# Patient Record
Sex: Female | Born: 1995 | Hispanic: Yes | Marital: Single | Smoking: Never smoker
Health system: Southern US, Community
[De-identification: ages and names within clinical notes are randomized; demographics above are authoritative.]

## PROBLEM LIST (undated history)

## (undated) DIAGNOSIS — F419 Anxiety disorder, unspecified: Secondary | ICD-10-CM

## (undated) DIAGNOSIS — R569 Unspecified convulsions: Secondary | ICD-10-CM

## (undated) DIAGNOSIS — F32A Depression, unspecified: Secondary | ICD-10-CM

## (undated) DIAGNOSIS — R51 Headache: Secondary | ICD-10-CM

## (undated) DIAGNOSIS — G8929 Other chronic pain: Secondary | ICD-10-CM

## (undated) DIAGNOSIS — F329 Major depressive disorder, single episode, unspecified: Secondary | ICD-10-CM

---
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
## 2014-10-24 ENCOUNTER — Encounter: Payer: Self-pay | Admitting: Diagnostic Neuroimaging

## 2014-10-24 ENCOUNTER — Ambulatory Visit (INDEPENDENT_AMBULATORY_CARE_PROVIDER_SITE_OTHER): Payer: Self-pay | Admitting: Diagnostic Neuroimaging

## 2014-10-24 VITALS — BP 102/67 | HR 78 | Ht 62.0 in | Wt 202.0 lb

## 2014-10-24 DIAGNOSIS — F411 Generalized anxiety disorder: Secondary | ICD-10-CM

## 2014-10-24 DIAGNOSIS — F32A Depression, unspecified: Secondary | ICD-10-CM

## 2014-10-24 DIAGNOSIS — F329 Major depressive disorder, single episode, unspecified: Secondary | ICD-10-CM

## 2014-10-24 DIAGNOSIS — G40209 Localization-related (focal) (partial) symptomatic epilepsy and epileptic syndromes with complex partial seizures, not intractable, without status epilepticus: Secondary | ICD-10-CM

## 2014-10-24 MED ORDER — LAMOTRIGINE 25 MG PO TABS: ORAL_TABLET | ORAL | Status: DC

## 2014-10-24 NOTE — Progress Notes (Signed)
[facility]  PATIENT: Tami Rodriguez DOB: [birth year]  REFERRING CLINICIAN: [facility] (Dr. Neita Carp) HISTORY FROM: patient and EPIC chart review REASON FOR VISIT: new consult     HISTORICAL  CHIEF COMPLAINT:  Chief Complaint  Patient presents with  . Seizures    rm 6, New Patient, second opinion    HISTORY OF PRESENT ILLNESS:   19 year old right-handed female here for evaluation of seizure disorder.  2014 patient had onset of episodes of staring spells, distraction, amnesia, confusion, with some generalized "shaking" movements. No tongue biting or incontinence. Patient went to the emergency room after these were witnessed by patient's boyfriend several times. 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 Sharene Skeans [facility]). She was started on Lamictal but could not afford due to cost. Carbamazepine was tried but this caused hives and patient went back to Lamictal. Ultimately she was able to afford medication with change in insurance. In 2015 levetiracetam was added to achieve better seizure control, but this was discontinued due to causing "anger spells". In April 2015 patient did not feel well on all these medications, and then stopped following up with neurology and stopped her antiseizure medications.  Over the past 1-1/2 years patient seizures have returned. Over the past 3-4 months she is having increasing episodes, almost on a daily basis, with staring and confusion. Since January 2016, she has developed significant depression and anxiety, including racing thoughts, auditory hallucinations, suicidal thoughts, irregular sleep patterns. She was seeing a psychologist, but now is in the process of transition to a new clinic.  Patient lives with her boyfriend currently. She is apparently estranged from her family, and lives a "lonely" life.  Patient is able to contract for  safety regarding her suicidal thoughts.   REVIEW OF SYSTEMS: Full 14 system review of systems performed and notable only for memory loss confusion headache slurred speech depression anxiety to much sleep not asleep decreased energy change in appetite disinterest in activity suicidal thoughts hallucinations racing thoughts blood in stool blurred vision weight gain.   ALLERGIES: Allergies  Allergen Reactions  . Tegretol [Carbamazepine] Rash  . Penicillins Rash    HOME MEDICATIONS: Outpatient Prescriptions Prior to Visit  Medication Sig Dispense Refill  . HYDROcodone-acetaminophen (NORCO/VICODIN) 5-325 MG per tablet Take one-two tabs po q 4-6 hrs prn pain 12 tablet 0  . predniSONE (DELTASONE) 10 MG tablet Take 6 tablets day one, 5 tablets day two, 4 tablets day three, 3 tablets day four, 2 tablets day five, then 1 tablet day six 21 tablet 0  . traMADol (ULTRAM) 50 MG tablet Take 1 tablet (50 mg total) by mouth every 6 (six) hours as needed. 15 tablet 0   No facility-administered medications prior to visit.    PAST MEDICAL HISTORY: Past Medical History  Diagnosis Date  . Chronic headaches   . Seizures     10/24/14 unsure of last sz    PAST SURGICAL HISTORY: Past Surgical History  Procedure Laterality Date  . Appendectomy  2011    FAMILY HISTORY: Family History  Problem Relation Age of Onset  . Stroke Maternal Grandmother     Died in her 60's  . Dementia Paternal Grandmother   . Seizures Cousin   . Healthy Sister   . Healthy Brother   . Healthy Sister     SOCIAL HISTORY:  Social History   Social History  . Marital Status: Single    Spouse Name:  N/A  . Number of Children: 0  . Years of Education: 12   Occupational History  .      [employer]   Social History Main Topics  . Smoking status: Never Smoker   . Smokeless tobacco: Never Used  . Alcohol Use: No  . Drug Use: No  . Sexual Activity:    Partners: Male    Birth Control/ Protection: Condom   Other Topics  Concern  . Not on file   Social History Narrative   Lives at home with sig other   Caffeine use- green tea 3 x a week, Red Bull occas     PHYSICAL EXAM  GENERAL EXAM/CONSTITUTIONAL: Vitals:  Filed Vitals:   10/24/14 0844  BP: 102/67  Pulse: 78  Height:  (1.575 m)  Weight: 202 lb (91.627 kg)     Body mass index is 36.94 kg/(m^2).  Visual Acuity Screening   Right eye Left eye Both eyes  Without correction: 20/200 20/100   With correction:     Comments: 10/24/14 glasses currently being repaired    Patient is in no distress; well developed, nourished and groomed; neck is supple  CARDIOVASCULAR:  Examination of carotid arteries is normal; no carotid bruits  Regular rate and rhythm, no murmurs  Examination of peripheral vascular system by observation and palpation is normal  EYES:  Ophthalmoscopic exam of optic discs and posterior segments is normal; no papilledema or hemorrhages  MUSCULOSKELETAL:  Gait, strength, tone, movements noted in Neurologic exam below  NEUROLOGIC: MENTAL STATUS:  No flowsheet data found.  awake, alert, oriented to person, place and time  recent and remote memory intact  normal attention and concentration  language fluent, comprehension intact, naming intact,   fund of knowledge appropriate  CRANIAL NERVE:   2nd - no papilledema on fundoscopic exam  2nd, 3rd, 4th, 6th - pupils equal and reactive to light, visual fields full to confrontation, extraocular muscles intact, no nystagmus  5th - facial sensation symmetric  7th - facial strength symmetric  8th - hearing intact  9th - palate elevates symmetrically, uvula midline  11th - shoulder shrug symmetric  12th - tongue protrusion midline  MOTOR:   normal bulk and tone, full strength in the BUE, BLE  SENSORY:   normal and symmetric to light touch, pinprick, temperature, vibration  COORDINATION:   finger-nose-finger, fine finger movements normal  REFLEXES:    deep tendon reflexes present and symmetric  GAIT/STATION:   narrow based gait; able to walk tandem; romberg is negative    DIAGNOSTIC DATA (LABS, IMAGING, TESTING) - I reviewed patient records, labs, notes, testing and imaging myself where available.  Lab Results  Component Value Date   WBC 5.3 09/17/2012   HGB 12.3 09/17/2012   HCT 35.5 09/17/2012   MCV 90 09/17/2012   PLT 171 09/17/2012      Component Value Date/Time   NA 139 06/02/2012 1501   K 4.0 06/02/2012 1501   CL 103 06/02/2012 1501   CO2 27 04/29/2012 2058   GLUCOSE 104* 06/02/2012 1501   BUN 3* 06/02/2012 1501   CREATININE 0.60 06/02/2012 1501   CALCIUM 9.3 04/29/2012 2058   PROT 7.6 04/29/2012 2058   ALBUMIN 4.0 04/29/2012 2058   AST 15 04/29/2012 2058   ALT 14 08/18/2012 0838   ALKPHOS 72 04/29/2012 2058   BILITOT 0.2* 04/29/2012 2058   GFRNONAA NOT CALCULATED 04/29/2012 2058   GFRAA NOT CALCULATED 04/29/2012 2058   No results found for: CHOL, HDL,  LDLCALC, LDLDIRECT, TRIG, CHOLHDL No results found for: ZOXW9U No results found for: VITAMINB12 No results found for: TSH   05/20/12 EEG  DESCRIPTION OF FINDINGS:  - Dominant frequency is 11 Hz, 30 microvolt alpha range activity that attenuates with eye opening. Background activity consists of low-voltage alpha and upper theta range, and frontally predominant beta range components. - During hyperventilation, the patient initially has nonepileptic behavior with nodding her head and movement of her body. This occurs at about 1 minutes and 50 seconds. The patient has sudden onset of 60 V rhythmic alpha range activity over the entire left hemisphere and coincident appearance 5 seconds later of 35-40 microvolt rhythmic theta range activity over the right hemisphere. This lasts for about 9 seconds. The technologist mentions that she has staring on her face but this could not be seen on the video. The patient then has 7 Hz generalized theta range activity,  which slows to 5 Hz predominantly over the left hemisphere and then generalized delta range activity. The entire episode lasted for 42 seconds. This involved pages 21 through 26 in the record. On page 103-107. A very similar event starts spontaneously and is associated with rhythmic alpha range activity of 10 Hz over the left hemisphere, and theta and upper delta range activity over the right hemisphere. Then, she has Generalized rhythmic theta range activity followed by rhythmic generalized delta range activity at the end. During this time, there is slight movement of the right leg. Her eyes remain closed.   IMPRESSION: The 2 episodes described above represent electrographic seizures. In the 1st, the technologist said that the patient was staring. In the 2nd, no notation was made concerning the behaviors. 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   07/17/12 MRI brain [I reviewed images myself. There may be slight decrease in size of left hippocampus compared to right. I do not appreciate significant difference in signal. -VRP]  1. Mild asymmetric increased signal of the right amygdala/hippocampal complex. Favor related to recent seizure activity. 2. Otherwise the MRI appearance of the brain is within normal limits.     ASSESSMENT AND PLAN  19 y.o. 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 Other significant factors include psychosocial stress, depression, anxiety, reduced support network, limited financial resources, lack of health insurance.   Dx:  Partial symptomatic epilepsy with complex partial seizures, not intractable, without status epilepticus  Depression  Anxiety state    PLAN: - restart lamotrigine - pregnancy  and anti-seizure medication risk/safety reviewed with patient - follow up with psychiatry/psychology ([facility]); advised her to go to ER immediately if mood or thoughts become more severe adn if she needs help for her safety or the safety of others - no driving until seizure free x 6 months  Meds ordered this encounter  Medications  . lamoTRIgine (LAMICTAL) 25 MG tablet    Sig: start LAMOTRIGINE  twice a day x 2 weeks; - then increase to  twice a day x 2 weeks; - then increase to  twice a day x 2 weeks; - then increase to  twice a day    Dispense:  120 tablet    Refill:  6   Return in about 6 weeks (around 12/05/2014).    Suanne Marker, MD 10/24/2014, 9:22 AM Certified in Neurology, Neurophysiology and Neuroimaging  [facility] [address] [place],  [address] [phone]

## 2014-10-24 NOTE — Patient Instructions (Addendum)
Thank you for coming to see [place] at [facility]. I hope we have been able to provide you high quality care today.  You may receive a patient satisfaction survey over the next few weeks. We would appreciate your feedback and comments so that we may continue to improve ourselves and the health of our patients.  - start LAMOTRIGINE 19m twice a day x 2 weeks; - then increase to 557mtwice a day x 2 weeks; - then increase to 7568mwice a day x 2 weeks; - then increase to 100m53mice a day  - According to [place] law, you can not drive unless you are seizure free for at least 6 months and under physician's care.   - Please maintain seizure precautions. Do not participate in activities where a loss of awareness could harm you or someone else. No swimming alone, no tub bathing, no hot tubs, no driving, no operating motorized vehicles (cars, ATVs, motocycles, etc), lawnmowers or power tools. No standing at heights, such as rooftops, ladders or stairs. Avoid hot objects such as stoves, heaters, open fires. Wear a helmet when riding a bicycle, scooter, skateboard, etc. and avoid areas of traffic. Set your water heater to 120 degrees or less.    ~~~~~~~~~~~~~~~~~~~~~~~~~~~~~~~~~~~~~~~~~~~~~~~~~~~~~~~~~~~~~~~~~  DR. PENUMALLI'S GUIDE TO HAPPY AND HEALTHY LIVING These are some of my general health and wellness recommendations. Some of them may apply to you better than others. Please use common sense as you try these suggestions and feel free to ask me any questions.   ACTIVITY/FITNESS Mental, social, emotional and physical stimulation are very important for brain and body health. Try learning a new activity (arts, music, language, sports, games).  Keep moving your body to the best of your abilities. You can do this at home, inside or outside, the park, community center, gym or anywhere you like. Consider a physical therapist or personal trainer to get started. Consider the app Sworkit. Fitness  trackers such as smart-watches, smart-phones or Fitbits can help as well.   NUTRITION Eat more plants: colorful vegetables, nuts, seeds and berries.  Eat less sugar, salt, preservatives and processed foods.  Avoid toxins such as cigarettes and alcohol.  Drink water when you are thirsty. Warm water with a slice of lemon is an excellent morning drink to start the day.  Consider these websites for more information The Nutrition Source (httphttps://www.henry-hernandez.biz/ecision Nutrition (www.WindowBlog.chRELAXATION Consider practicing mindfulness meditation or other relaxation techniques such as deep breathing, prayer, yoga, tai chi, massage. See website mindful.org or the apps Headspace or Calm to help get started.   SLEEP Try to get at least 7-8+ hours sleep per day. Regular exercise and reduced caffeine will help you sleep better. Practice good sleep hygeine techniques. See website sleep.org for more information.

## 2014-11-29 ENCOUNTER — Encounter (HOSPITAL_COMMUNITY): Payer: Self-pay | Admitting: Emergency Medicine

## 2014-11-29 ENCOUNTER — Emergency Department (HOSPITAL_COMMUNITY)
Admission: EM | Admit: 2014-11-29 | Discharge: 2014-11-29 | Disposition: A | Discharge status: Home / Self Care | Payer: Self-pay | Attending: Emergency Medicine | Admitting: Emergency Medicine

## 2014-11-29 ENCOUNTER — Emergency Department (HOSPITAL_COMMUNITY): Payer: Self-pay

## 2014-11-29 DIAGNOSIS — Y998 Other external cause status: Secondary | ICD-10-CM | POA: Insufficient documentation

## 2014-11-29 DIAGNOSIS — W010XXA Fall on same level from slipping, tripping and stumbling without subsequent striking against object, initial encounter: Secondary | ICD-10-CM | POA: Insufficient documentation

## 2014-11-29 DIAGNOSIS — Y9289 Other specified places as the place of occurrence of the external cause: Secondary | ICD-10-CM | POA: Insufficient documentation

## 2014-11-29 DIAGNOSIS — G8929 Other chronic pain: Secondary | ICD-10-CM | POA: Insufficient documentation

## 2014-11-29 DIAGNOSIS — Y9389 Activity, other specified: Secondary | ICD-10-CM | POA: Insufficient documentation

## 2014-11-29 DIAGNOSIS — Z880 Allergy status to penicillin: Secondary | ICD-10-CM | POA: Insufficient documentation

## 2014-11-29 DIAGNOSIS — S93401A Sprain of unspecified ligament of right ankle, initial encounter: Secondary | ICD-10-CM | POA: Insufficient documentation

## 2014-11-29 DIAGNOSIS — G40909 Epilepsy, unspecified, not intractable, without status epilepticus: Secondary | ICD-10-CM | POA: Insufficient documentation

## 2014-11-29 MED ORDER — IBUPROFEN 800 MG PO TABS
800.0000 mg | ORAL_TABLET | Freq: Three times a day (TID) | ORAL | Status: DC
Start: 2014-11-29 — End: 2015-02-20

## 2014-11-29 NOTE — ED Provider Notes (Signed)
CSN: [id number]     Arrival date & time 11/29/14  1637 History   First MD Initiated Contact with Patient 11/29/14 1647     Chief Complaint  Patient presents with  . Ankle Injury     (Consider location/radiation/quality/duration/timing/severity/associated sxs/prior Treatment) HPI  Tami BaldingMaria Rodriguez is a 19 y.o. female who presents to the Emergency Department complaining of sudden onset of right ankle pain and swelling that began after a twisting injury to the right ankle.  Injury occurred this morning and she complains of increasing pain.  Pain is worse with weight bearing and focused to the outside portion of her ankle.  She denies proximal pain, numbness or weakness, open wound, or other injuries.  She has not applied ice or taken any medications for her symptoms.     Past Medical History  Diagnosis Date  . Chronic headaches   . Seizures (HCC)     10/24/14 unsure of last sz   Past Surgical History  Procedure Laterality Date  . Appendectomy  2011   Family History  Problem Relation Age of Onset  . Stroke Maternal Grandmother     Died in her 5250's  . Dementia Paternal Grandmother   . Seizures Cousin   . Healthy Sister   . Healthy Brother   . Healthy Sister    Social History  Substance Use Topics  . Smoking status: Never Smoker   . Smokeless tobacco: Never Used  . Alcohol Use: No   OB History    No data available     Review of Systems  Constitutional: Negative for fever and chills.  Genitourinary: Negative for dysuria and difficulty urinating.  Musculoskeletal: Positive for joint swelling and arthralgias (right ankle pain and swelling).  Skin: Negative for color change and wound.  Neurological: Negative for weakness and numbness.  All other systems reviewed and are negative.     Allergies  Tegretol and Penicillins  Home Medications   Prior to Admission medications   Medication Sig Start Date End Date Taking? Authorizing Provider  lamoTRIgine (LAMICTAL) 25 MG tablet  start LAMOTRIGINE 25mg  twice a day x 2 weeks; - then increase to 50mg  twice a day x 2 weeks; - then increase to 75mg  twice a day x 2 weeks; - then increase to 100mg  twice a day 10/24/14   Suanne MarkerVikram R Penumalli, MD  naproxen (NAPROSYN) 250 MG tablet Take 250 mg by mouth 2 (two) times daily as needed.    Historical Provider, MD   BP 122/62 mmHg  Pulse 92  Temp(Src) 98.3 F (36.8 C) (Oral)  Resp 14  Ht 5\' 2"  (1.575 m)  Wt 200 lb (90.719 kg)  BMI 36.57 kg/m2  SpO2 98%   Physical Exam  Constitutional: She is oriented to person, place, and time. She appears well-developed and well-nourished. No distress.  HENT:  Head: Normocephalic and atraumatic.  Cardiovascular: Normal rate, regular rhythm, normal heart sounds and intact distal pulses.   Pulmonary/Chest: Effort normal and breath sounds normal. No respiratory distress.  Musculoskeletal: She exhibits edema and tenderness.  Tenderness and edema of the lateral right ankle.  DP pulse is brisk,distal sensation intact.  No erythema, wound, bruising or bony deformity.  No proximal tenderness.  Neurological: She is alert and oriented to person, place, and time. She exhibits normal muscle tone. Coordination normal.  Skin: Skin is warm and dry.  Nursing note and vitals reviewed.   ED Course  Procedures (including critical care time)  Imaging Review Dg Ankle Complete Right  11/29/2014  CLINICAL DATA:  Right ankle pain status post fall, moderate swelling EXAM: RIGHT ANKLE - COMPLETE 3+ VIEW COMPARISON:  None. FINDINGS: No fracture or dislocation is seen. The ankle mortise is intact. The base of the fifth metatarsal is unremarkable. Moderate swelling overlying the lateral malleolus. IMPRESSION: No fracture or dislocation is seen. Moderate lateral soft tissue swelling. Electronically Signed   By: Charline Bills M.D.   On: 11/29/2014 17:13   I have personally reviewed and evaluated these images and lab results as part of my medical  decision-making.    MDM   Final diagnoses:  Ankle sprain, right, initial encounter    ASO applied, patient has crutches at home  NV intact, pain improved.  She agrees to symptomatic tx, RICE and orthopedic f/u in one week if needed.      Rosey Bath 11/29/14 2035  Bethann Berkshire, MD 11/29/14 2159

## 2014-11-29 NOTE — ED Notes (Signed)
Pt reports "stumbled and fell" on rocks this am. Pt reports right ankle pain ever since. Moderate swelling noted to right ankle. Pt able to bear weight but with increased pain.nad noted.

## 2014-11-29 NOTE — Discharge Instructions (Signed)
Ankle Sprain °An ankle sprain is an injury to the strong, fibrous tissues (ligaments) that hold your ankle bones together.  °HOME CARE  °· Put ice on your ankle for 1-2 days or as told by your doctor. °¨ Put ice in a plastic bag. °¨ Place a towel between your skin and the bag. °¨ Leave the ice on for 15-20 minutes at a time, every 2 hours while you are awake. °· Only take medicine as told by your doctor. °· Raise (elevate) your injured ankle above the level of your heart as much as possible for 2-3 days. °· Use crutches if your doctor tells you to. Slowly put your own weight on the affected ankle. Use the crutches until you can walk without pain. °· If you have a plaster splint: °¨ Do not rest it on anything harder than a pillow for 24 hours. °¨ Do not put weight on it. °¨ Do not get it wet. °¨ Take it off to shower or bathe. °· If given, use an elastic wrap or support stocking for support. Take the wrap off if your toes lose feeling (numb), tingle, or turn cold or blue. °· If you have an air splint: °¨ Add or let out air to make it comfortable. °¨ Take it off at night and to shower and bathe. °¨ Wiggle your toes and move your ankle up and down often while you are wearing it. °GET HELP IF: °· You have rapidly increasing bruising or puffiness (swelling). °· Your toes feel very cold. °· You lose feeling in your foot. °· Your medicine does not help your pain. °GET HELP RIGHT AWAY IF:  °· Your toes lose feeling (numb) or turn blue. °· You have severe pain that is increasing. °MAKE SURE YOU:  °· Understand these instructions. °· Will watch your condition. °· Will get help right away if you are not doing well or get worse. °  °This information is not intended to replace advice given to you by your health care provider. Make sure you discuss any questions you have with your health care provider. °  °Document Released: 07/03/2007 Document Revised: 02/04/2014 Document Reviewed: 07/29/2011 °Elsevier Interactive Patient  Education ©2016 [employer]vier Inc. ° °

## 2014-12-06 ENCOUNTER — Ambulatory Visit (INDEPENDENT_AMBULATORY_CARE_PROVIDER_SITE_OTHER): Payer: Self-pay | Admitting: Diagnostic Neuroimaging

## 2014-12-06 ENCOUNTER — Encounter: Payer: Self-pay | Admitting: Diagnostic Neuroimaging

## 2014-12-06 VITALS — BP 117/75 | HR 83 | Ht 62.0 in | Wt 200.0 lb

## 2014-12-06 DIAGNOSIS — N912 Amenorrhea, unspecified: Secondary | ICD-10-CM

## 2014-12-06 DIAGNOSIS — R110 Nausea: Secondary | ICD-10-CM

## 2014-12-06 DIAGNOSIS — G40209 Localization-related (focal) (partial) symptomatic epilepsy and epileptic syndromes with complex partial seizures, not intractable, without status epilepticus: Secondary | ICD-10-CM

## 2014-12-06 MED ORDER — LAMOTRIGINE 100 MG PO TABS: 100.0000 mg | ORAL_TABLET | Freq: Two times a day (BID) | ORAL | Status: DC

## 2014-12-06 NOTE — Patient Instructions (Signed)
-   continue lamotrigine 100mg  twice a day - I will check pregnancy test today - follow up with Dr. Neita CarpSasser and psychiatry regarding nausea, fatigue, chill and possible medication side effects

## 2014-12-06 NOTE — Progress Notes (Signed)
9  [facility]  PATIENT: Tami Rodriguez DOB: [birth year]  REFERRING CLINICIAN: [facility] (Dr. Neita Carp) HISTORY FROM: patient and EPIC chart review REASON FOR VISIT: follow up     HISTORICAL  CHIEF COMPLAINT:  Chief Complaint  Patient presents with  . Epilepsy    rm 7  . Follow-up    6 weeks    HISTORY OF PRESENT ILLNESS:   UPDATE 12/06/14: Since last visit, started LTG, now doing better. Within 1-2 weeks, stopped having sz spells. Also started quetiapine since last visit and doing better. Now with intermittent nausea, early AM, and has missed her period. She states she does not think she is pregnant, but she is sexually active. She has not had period since June 2016.   PRIOR HPI (10/24/14): 19 year old right-handed female here for evaluation of seizure disorder. 2014 patient had onset of episodes of staring spells, distraction, amnesia, confusion, with some generalized "shaking" movements. No tongue biting or incontinence. Patient went to the emergency room after these were witnessed by patient's boyfriend several times. 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 Sharene Skeans [facility]). She was started on Lamictal but could not afford due to cost. Carbamazepine was tried but this caused hives and patient went back to Lamictal. Ultimately she was able to afford medication with change in insurance. In 2015 levetiracetam was added to achieve better seizure control, but this was discontinued due to causing "anger spells". In April 2015 patient did not feel well on all these medications, and then stopped following up with neurology and stopped her antiseizure medications. Over the past 1-1/2 years patient seizures have returned. Over the past 3-4 months she is having increasing episodes, almost on a daily basis, with staring and confusion. Since January 2016, she has developed significant  depression and anxiety, including racing thoughts, auditory hallucinations, suicidal thoughts, irregular sleep patterns. She was seeing a psychologist, but now is in the process of transition to a new clinic. Patient lives with her boyfriend currently. She is apparently estranged from her family, and lives a "lonely" life. Patient is able to contract for safety regarding her suicidal thoughts.   REVIEW OF SYSTEMS: Full 14 system review of systems performed and notable only for headache dizziness  depresion anxiety nausea chills fatigue  ALLERGIES: Allergies  Allergen Reactions  . Tegretol [Carbamazepine] Rash  . Penicillins Rash    HOME MEDICATIONS: Outpatient Prescriptions Prior to Visit  Medication Sig Dispense Refill  . ibuprofen (ADVIL,MOTRIN) 800 MG tablet Take 1 tablet (800 mg total) by mouth 3 (three) times daily. 21 tablet 0  . lamoTRIgine (LAMICTAL) 25 MG tablet start LAMOTRIGINE  twice a day x 2 weeks; - then increase to  twice a day x 2 weeks; - then increase to  twice a day x 2 weeks; - then increase to  twice a day 120 tablet 6  . naproxen (NAPROSYN) 250 MG tablet Take 250 mg by mouth 2 (two) times daily as needed.     No facility-administered medications prior to visit.    PAST MEDICAL HISTORY: Past Medical History  Diagnosis Date  . Chronic headaches   . Seizures (HCC)     10/24/14 unsure of last sz    PAST SURGICAL HISTORY: Past Surgical History  Procedure Laterality Date  . Appendectomy  2011    FAMILY HISTORY: Family History  Problem Relation Age of Onset  . Stroke Maternal Grandmother     Died  in her 33's  . Dementia Paternal Grandmother   . Seizures Cousin   . Healthy Sister   . Healthy Brother   . Healthy Sister     SOCIAL HISTORY:  Social History   Social History  . Marital Status: Single    Spouse Name: N/A  . Number of Children: 0  . Years of Education: 12   Occupational History  .      [employer]   Social History  Main Topics  . Smoking status: Never Smoker   . Smokeless tobacco: Never Used  . Alcohol Use: No  . Drug Use: No  . Sexual Activity:    Partners: Male    Birth Control/ Protection: Condom   Other Topics Concern  . Not on file   Social History Narrative   Lives at home with sig other   Caffeine use- green tea 3 x a week, Red Bull occas     PHYSICAL EXAM  GENERAL EXAM/CONSTITUTIONAL: Vitals:  Filed Vitals:   12/06/14 1253  BP: 117/75  Pulse: 83  Height:  (1.575 m)  Weight: 200 lb (90.719 kg)   Body mass index is 36.57 kg/(m^2). No exam data present  Patient is in no distress; well developed, nourished and groomed; neck is supple  CARDIOVASCULAR:  Examination of carotid arteries is normal; no carotid bruits  Regular rate and rhythm, no murmurs  Examination of peripheral vascular system by observation and palpation is normal   MUSCULOSKELETAL:  Gait, strength, tone, movements noted in Neurologic exam below  NEUROLOGIC: MENTAL STATUS:  No flowsheet data found.  awake, alert, oriented to person, place and time  recent and remote memory intact  normal attention and concentration  language fluent, comprehension intact, naming intact,   fund of knowledge appropriate  CRANIAL NERVE:   2nd, 3rd, 4th, 6th - pupils equal and reactive to light, visual fields full to confrontation, extraocular muscles intact, no nystagmus  5th - facial sensation symmetric  7th - facial strength symmetric  8th - hearing intact  9th - palate elevates symmetrically, uvula midline  11th - shoulder shrug symmetric  12th - tongue protrusion midline  MOTOR:   normal bulk and tone, full strength in the BUE, BLE  SENSORY:   normal and symmetric to light touch, temperature, vibration  COORDINATION:   finger-nose-finger, fine finger movements normal  REFLEXES:   deep tendon reflexes present and symmetric  GAIT/STATION:   narrow based gait; able to walk tandem;  romberg is negative    DIAGNOSTIC DATA (LABS, IMAGING, TESTING) - I reviewed patient records, labs, notes, testing and imaging myself where available.  Lab Results  Component Value Date   WBC 5.3 09/17/2012   HGB 12.3 09/17/2012   HCT 35.5 09/17/2012   MCV 90 09/17/2012   PLT 171 09/17/2012      Component Value Date/Time   NA 139 06/02/2012 1501   K 4.0 06/02/2012 1501   CL 103 06/02/2012 1501   CO2 27 04/29/2012 2058   GLUCOSE 104* 06/02/2012 1501   BUN 3* 06/02/2012 1501   CREATININE 0.60 06/02/2012 1501   CALCIUM 9.3 04/29/2012 2058   PROT 7.6 04/29/2012 2058   ALBUMIN 4.0 04/29/2012 2058   AST 15 04/29/2012 2058   ALT 14 08/18/2012 0838   ALKPHOS 72 04/29/2012 2058   BILITOT 0.2* 04/29/2012 2058   GFRNONAA NOT CALCULATED 04/29/2012 2058   GFRAA NOT CALCULATED 04/29/2012 2058   No results found for: CHOL, HDL, LDLCALC, LDLDIRECT, TRIG, CHOLHDL No  results found for: HGBA1C No results found for: VITAMINB12 No results found for: TSH   05/20/12 EEG  DESCRIPTION OF FINDINGS:  - Dominant frequency is 11 Hz, 30 microvolt alpha range activity that attenuates with eye opening. Background activity consists of low-voltage alpha and upper theta range, and frontally predominant beta range components. - During hyperventilation, the patient initially has nonepileptic behavior with nodding her head and movement of her body. This occurs at about 1 minutes and 50 seconds. The patient has sudden onset of 60 V rhythmic alpha range activity over the entire left hemisphere and coincident appearance 5 seconds later of 35-40 microvolt rhythmic theta range activity over the right hemisphere. This lasts for about 9 seconds. The technologist mentions that she has staring on her face but this could not be seen on the video. The patient then has 7 Hz generalized theta range activity, which slows to 5 Hz predominantly over the left hemisphere and then generalized delta range activity. The  entire episode lasted for 42 seconds. This involved pages 21 through 26 in the record. On page 103-107. A very similar event starts spontaneously and is associated with rhythmic alpha range activity of 10 Hz over the left hemisphere, and theta and upper delta range activity over the right hemisphere. Then, she has Generalized rhythmic theta range activity followed by rhythmic generalized delta range activity at the end. During this time, there is slight movement of the right leg. Her eyes remain closed.   IMPRESSION: The 2 episodes described above represent electrographic seizures. In the 1st, the technologist said that the patient was staring. In the 2nd, no notation was made concerning the behaviors. 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   07/17/12 MRI brain [I reviewed images myself. There may be slight decrease in size of left hippocampus compared to right. I do not appreciate significant difference in signal. -VRP]  1. Mild asymmetric increased signal of the right amygdala/hippocampal complex. Favor related to recent seizure activity. 2. Otherwise the MRI appearance of the brain is within normal limits.     ASSESSMENT AND PLAN  19 y.o. 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 Other significant factors include psychosocial stress, depression, anxiety, reduced support network, limited financial resources, lack of health insurance.  Now back on lamotrigine 100mg  twice a day. No more seizures.   Dx:  No diagnosis found.   PLAN: - continue lamotrigine 100mg  BID - pregnancy and anti-seizure medication risk/safety reviewed with patient - will check pregnancy test (missed period, early AM nausea); then follow up with PCP or  ob/gyn - follow up with psychiatry/psychology ([facility])  Orders Placed This Encounter  Procedures  . Pregnancy, urine   Meds ordered this encounter  Medications  . lamoTRIgine (LAMICTAL) 100 MG tablet    Sig: Take 1 tablet (100 mg total) by mouth 2 (two) times daily.    Dispense:  60 tablet    Refill:  12   Return in about 3 months (around 03/08/2015).    Suanne MarkerVIKRAM R. Latroya Ng, MD 12/06/2014, 1:08 PM Certified in Neurology, Neurophysiology and Neuroimaging  [facility] [address], [address] [place], [place] [address] [phone]

## 2014-12-07 ENCOUNTER — Telehealth: Payer: Self-pay | Admitting: *Deleted

## 2014-12-07 LAB — PREGNANCY, URINE: PREG TEST UR: NEGATIVE

## 2014-12-07 NOTE — Telephone Encounter (Signed)
Spoke with patient and informed her that her lab test yesterday was negative. She stated she was vomiting last night, is not at this time. Advised she call her PCP and request to be seen this week to be evaluated. She verbalized understanding, agreement.

## 2015-02-20 ENCOUNTER — Emergency Department (HOSPITAL_COMMUNITY)
Admission: EM | Admit: 2015-02-20 | Discharge: 2015-02-20 | Disposition: A | Discharge status: Home / Self Care | Payer: Self-pay | Attending: Emergency Medicine | Admitting: Emergency Medicine

## 2015-02-20 ENCOUNTER — Encounter (HOSPITAL_COMMUNITY): Payer: Self-pay | Admitting: *Deleted

## 2015-02-20 ENCOUNTER — Emergency Department (HOSPITAL_COMMUNITY): Payer: Self-pay

## 2015-02-20 DIAGNOSIS — F419 Anxiety disorder, unspecified: Secondary | ICD-10-CM | POA: Insufficient documentation

## 2015-02-20 DIAGNOSIS — R079 Chest pain, unspecified: Secondary | ICD-10-CM | POA: Insufficient documentation

## 2015-02-20 DIAGNOSIS — Z79899 Other long term (current) drug therapy: Secondary | ICD-10-CM | POA: Insufficient documentation

## 2015-02-20 DIAGNOSIS — Z880 Allergy status to penicillin: Secondary | ICD-10-CM | POA: Insufficient documentation

## 2015-02-20 DIAGNOSIS — F3130 Bipolar disorder, current episode depressed, mild or moderate severity, unspecified: Secondary | ICD-10-CM | POA: Insufficient documentation

## 2015-02-20 DIAGNOSIS — Z791 Long term (current) use of non-steroidal anti-inflammatories (NSAID): Secondary | ICD-10-CM | POA: Insufficient documentation

## 2015-02-20 DIAGNOSIS — G8929 Other chronic pain: Secondary | ICD-10-CM | POA: Insufficient documentation

## 2015-02-20 HISTORY — DX: Anxiety disorder, unspecified: F41.9

## 2015-02-20 HISTORY — DX: Depression, unspecified: F32.A

## 2015-02-20 HISTORY — DX: Major depressive disorder, single episode, unspecified: F32.9

## 2015-02-20 LAB — BASIC METABOLIC PANEL
ANION GAP: 6 (ref 5–15)
BUN: 10 mg/dL (ref 6–20)
CALCIUM: 9.2 mg/dL (ref 8.9–10.3)
CO2: 26 mmol/L (ref 22–32)
CREATININE: 0.7 mg/dL (ref 0.44–1.00)
Chloride: 108 mmol/L (ref 101–111)
Glucose, Bld: 120 mg/dL — ABNORMAL HIGH (ref 65–99)
Potassium: 4.3 mmol/L (ref 3.5–5.1)
Sodium: 140 mmol/L (ref 135–145)

## 2015-02-20 LAB — CBC
HCT: 35 % — ABNORMAL LOW (ref 36.0–46.0)
HEMOGLOBIN: 12 g/dL (ref 12.0–15.0)
MCH: 30 pg (ref 26.0–34.0)
MCHC: 34.3 g/dL (ref 30.0–36.0)
MCV: 87.5 fL (ref 78.0–100.0)
PLATELETS: 212 10*3/uL (ref 150–400)
RBC: 4 MIL/uL (ref 3.87–5.11)
RDW: 12.7 % (ref 11.5–15.5)
WBC: 9.8 10*3/uL (ref 4.0–10.5)

## 2015-02-20 LAB — I-STAT TROPONIN, ED: TROPONIN I, POC: 0 ng/mL (ref 0.00–0.08)

## 2015-02-20 MED ORDER — IBUPROFEN 800 MG PO TABS: 800.0000 mg | ORAL_TABLET | Freq: Three times a day (TID) | ORAL | Status: DC | PRN

## 2015-02-20 MED ORDER — KETOROLAC TROMETHAMINE 30 MG/ML IJ SOLN
30.0000 mg | Freq: Once | INTRAMUSCULAR | Status: AC
  Administered 2015-02-20: 30 mg via INTRAVENOUS
  Filled 2015-02-20: qty 1

## 2015-02-20 NOTE — ED Notes (Signed)
Pt reports mid chest pain that started while at work tonight. Describes pain as sharp, sticking pain. Pt states SOB w/ chest pains.

## 2015-02-20 NOTE — ED Provider Notes (Signed)
TIME SEEN: 1:30 AM  CHIEF COMPLAINT: Chest pain  HPI: Pt is a 20 y.o. female with history of seizures on Lamictal, depression who presents emergency with left-sided sharp nonradiating chest pain that started while at work this evening. Pain is worse with deep inspiration and palpation. It is not exertional. States she felt short of breath because it hurts to take a deep breath. No nausea, vomiting, dizziness or diaphoresis. No fevers or cough. No family history of premature CAD.  No history of PE, DVT, exogenous estrogen use, fracture, surgery, trauma, hospitalization, prolonged travel. No lower extremity swelling or pain. No calf tenderness.    ROS: See HPI Constitutional: no fever  Eyes: no drainage  ENT: no runny nose   Cardiovascular:   chest pain  Resp: no SOB  GI: no vomiting GU: no dysuria Integumentary: no rash  Allergy: no hives  Musculoskeletal: no leg swelling  Neurological: no slurred speech ROS otherwise negative  PAST MEDICAL HISTORY/PAST SURGICAL HISTORY:  Past Medical History  Diagnosis Date  . Chronic headaches   . Seizures (HCC)     10/24/14 unsure of last sz  . Depression   . Bipolar affect, depressed (HCC)   . Anxiety     MEDICATIONS:  Prior to Admission medications   Medication Sig Start Date End Date Taking? Authorizing Provider  ARIPiprazole (ABILIFY) 20 MG tablet Take 20 mg by mouth daily.   Yes Historical Provider, MD  lamoTRIgine (LAMICTAL) 100 MG tablet Take 1 tablet (100 mg total) by mouth 2 (two) times daily. 12/06/14  Yes Suanne Marker, MD  ibuprofen (ADVIL,MOTRIN) 800 MG tablet Take 1 tablet (800 mg total) by mouth 3 (three) times daily. 11/29/14   Tammy Triplett, PA-C  naproxen (NAPROSYN) 250 MG tablet Take 250 mg by mouth 2 (two) times daily as needed.    Historical Provider, MD  QUEtiapine (SEROQUEL) 100 MG tablet Take 100 mg by mouth at bedtime.    Historical Provider, MD    ALLERGIES:  Allergies  Allergen Reactions  . Tegretol  [Carbamazepine] Rash  . Penicillins Rash    SOCIAL HISTORY:  Social History  Substance Use Topics  . Smoking status: Never Smoker   . Smokeless tobacco: Never Used  . Alcohol Use: No    FAMILY HISTORY: Family History  Problem Relation Age of Onset  . Stroke Maternal Grandmother     Died in her 61's  . Dementia Paternal Grandmother   . Seizures Cousin   . Healthy Sister   . Healthy Brother   . Healthy Sister     EXAM: BP 116/76 mmHg  Pulse 75  Temp(Src) 98.8 F (37.1 C) (Oral)  Resp 16  Ht  (1.575 m)  Wt 196 lb (88.905 kg)  BMI 35.84 kg/m2  SpO2 100%  LMP 06/20/2014 CONSTITUTIONAL: Alert and oriented and responds appropriately to questions. Well-appearing; well-nourished HEAD: Normocephalic EYES: Conjunctivae clear, PERRL ENT: normal nose; no rhinorrhea; moist mucous membranes; pharynx without lesions noted NECK: Supple, no meningismus, no LAD  CARD: RRR; S1 and S2 appreciated; no murmurs, no clicks, no rubs, no gallops CHEST:  Tender to palpation over the left chest wall without crepitus, ecchymosis, deformity, rash or other lesions RESP: Normal chest excursion without splinting or tachypnea; breath sounds clear and equal bilaterally; no wheezes, no rhonchi, no rales, no hypoxia or respiratory distress, speaking full sentences ABD/GI: Normal bowel sounds; non-distended; soft, non-tender, no rebound, no guarding, no peritoneal signs BACK:  The back appears normal and is non-tender to  palpation, there is no CVA tenderness EXT: Normal ROM in all joints; non-tender to palpation; no edema; normal capillary refill; no cyanosis, no calf tenderness or swelling    SKIN: Normal color for age and race; warm NEURO: Moves all extremities equally, sensation to light touch intact diffusely, cranial nerves II through XII intact PSYCH: The patient's mood and manner are appropriate. Grooming and personal hygiene are appropriate.  MEDICAL DECISION MAKING: Patient here with very  atypical chest pain that started earlier in the evening while at work. Pain had already improved prior to arrival in the emergency department. Hemodynamically stable. EKG shows normal sinus rhythm without ischemic changes, arrhythmia or interval change. She has no risk factors for ACS or pulmonary embolus. Doubt dissection. Pain reproducible with palpation. I suspect that this is chest wall, musculoskeletal pain. Labs ordered in triage are unremarkable including negative troponin. Chest x-ray is clear with no sign of widened mediastinum, pneumothorax, edema or infiltrate. Patient's pain has improved with IV Toradol. I feel she is safe to be discharged home with outpatient follow-up. Have advised her to take anti-inflammatories for pain. Discussed return precautions. She verbalized understanding and is comfortable with this plan.     EKG Interpretation  Date/Time:  Monday February 20 2015 01:27:59 EST Ventricular Rate:  75 PR Interval:  124 QRS Duration: 85 QT Interval:  383 QTC Calculation: 428 R Axis:   74 Text Interpretation:  Sinus rhythm No significant change since last tracing Confirmed by WARD,  DO, KRISTEN [phone]) on 02/20/2015 1:36:19 AM        Layla Maw Ward, DO 02/20/15 [id number]

## 2015-02-20 NOTE — Discharge Instructions (Signed)

## 2015-02-23 ENCOUNTER — Encounter (HOSPITAL_COMMUNITY): Payer: Self-pay | Admitting: *Deleted

## 2015-02-23 ENCOUNTER — Emergency Department (HOSPITAL_COMMUNITY)
Admission: EM | Admit: 2015-02-23 | Discharge: 2015-02-23 | Disposition: A | Discharge status: Home / Self Care | Payer: Self-pay | Attending: Emergency Medicine | Admitting: Emergency Medicine

## 2015-02-23 DIAGNOSIS — G8929 Other chronic pain: Secondary | ICD-10-CM | POA: Insufficient documentation

## 2015-02-23 DIAGNOSIS — Z3202 Encounter for pregnancy test, result negative: Secondary | ICD-10-CM | POA: Insufficient documentation

## 2015-02-23 DIAGNOSIS — Z79899 Other long term (current) drug therapy: Secondary | ICD-10-CM | POA: Insufficient documentation

## 2015-02-23 DIAGNOSIS — F32A Depression, unspecified: Secondary | ICD-10-CM

## 2015-02-23 DIAGNOSIS — F329 Major depressive disorder, single episode, unspecified: Secondary | ICD-10-CM | POA: Insufficient documentation

## 2015-02-23 DIAGNOSIS — Z880 Allergy status to penicillin: Secondary | ICD-10-CM | POA: Insufficient documentation

## 2015-02-23 DIAGNOSIS — F419 Anxiety disorder, unspecified: Secondary | ICD-10-CM | POA: Insufficient documentation

## 2015-02-23 LAB — CBC WITH DIFFERENTIAL/PLATELET
BASOS PCT: 0 %
Basophils Absolute: 0 10*3/uL (ref 0.0–0.1)
EOS ABS: 0.1 10*3/uL (ref 0.0–0.7)
Eosinophils Relative: 2 %
HCT: 37.5 % (ref 36.0–46.0)
HEMOGLOBIN: 12.9 g/dL (ref 12.0–15.0)
Lymphocytes Relative: 23 %
Lymphs Abs: 1.7 10*3/uL (ref 0.7–4.0)
MCH: 30.1 pg (ref 26.0–34.0)
MCHC: 34.4 g/dL (ref 30.0–36.0)
MCV: 87.6 fL (ref 78.0–100.0)
Monocytes Absolute: 0.6 10*3/uL (ref 0.1–1.0)
Monocytes Relative: 8 %
NEUTROS PCT: 67 %
Neutro Abs: 5 10*3/uL (ref 1.7–7.7)
Platelets: 206 10*3/uL (ref 150–400)
RBC: 4.28 MIL/uL (ref 3.87–5.11)
RDW: 12.8 % (ref 11.5–15.5)
WBC: 7.4 10*3/uL (ref 4.0–10.5)

## 2015-02-23 LAB — COMPREHENSIVE METABOLIC PANEL
ALK PHOS: 81 U/L (ref 38–126)
ALT: 21 U/L (ref 14–54)
AST: 20 U/L (ref 15–41)
Albumin: 4 g/dL (ref 3.5–5.0)
Anion gap: 8 (ref 5–15)
BUN: 10 mg/dL (ref 6–20)
CALCIUM: 9.2 mg/dL (ref 8.9–10.3)
CO2: 26 mmol/L (ref 22–32)
CREATININE: 0.59 mg/dL (ref 0.44–1.00)
Chloride: 106 mmol/L (ref 101–111)
Glucose, Bld: 119 mg/dL — ABNORMAL HIGH (ref 65–99)
Potassium: 4.1 mmol/L (ref 3.5–5.1)
Sodium: 140 mmol/L (ref 135–145)
Total Bilirubin: 0.3 mg/dL (ref 0.3–1.2)
Total Protein: 7.2 g/dL (ref 6.5–8.1)

## 2015-02-23 LAB — RAPID URINE DRUG SCREEN, HOSP PERFORMED
AMPHETAMINES: NOT DETECTED
Barbiturates: NOT DETECTED
Benzodiazepines: NOT DETECTED
COCAINE: NOT DETECTED
OPIATES: NOT DETECTED
TETRAHYDROCANNABINOL: NOT DETECTED

## 2015-02-23 LAB — ETHANOL: Alcohol, Ethyl (B): <5 mg/dL (ref <5)

## 2015-02-23 LAB — PREGNANCY, URINE: Preg Test, Ur: NEGATIVE

## 2015-02-23 MED ORDER — LAMOTRIGINE 100 MG PO TABS
100.0000 mg | ORAL_TABLET | Freq: Two times a day (BID) | ORAL | Status: DC
  Filled 2015-02-23 (×2): qty 1

## 2015-02-23 MED ORDER — ACETAMINOPHEN 325 MG PO TABS
650.0000 mg | ORAL_TABLET | Freq: Once | ORAL | Status: AC
  Administered 2015-02-23: 650 mg via ORAL
  Filled 2015-02-23: qty 2

## 2015-02-23 MED ORDER — ARIPIPRAZOLE 10 MG PO TABS
20.0000 mg | ORAL_TABLET | Freq: Every day | ORAL | Status: DC
  Filled 2015-02-23 (×2): qty 2

## 2015-02-23 NOTE — ED Notes (Signed)
Patient states depression for "awhile", but increased over last 3 days. States that her psychiatrist increased her Ambilify to 20 mg last week. Denies any recent stressors, disagreements, etc, recently. Sad, depressed, tearful. Cooperative with nurse and other staff at present.

## 2015-02-23 NOTE — ED Notes (Signed)
Pt has hx of depression that has worsened over the last 3-4 days. Pt states, "I don't want to hurt myself I just don't want to live." Pt has medication for this prescribed and is taking it with no relief. Pt denies AVH.

## 2015-02-23 NOTE — Discharge Instructions (Signed)
Suicidal Feelings: How to Help Yourself  Suicide is the taking of one's own life. If you feel as though life is getting too tough to handle and are thinking about suicide, get help right away. To get help:  · Call your local emergency services (911 in the [place]  · Call a suicide hotline to speak with a trained counselor who understands how you are feeling. The following is a list of suicide hotlines in the [place]. For a list of hotlines in [place], visit www.suicide.org/hotlines/international/[place]-suicide-hotlines.html.  ·  1-800-273-TALK (1-800-273-8255).  ·  1-800-SUICIDE (1-800-784-2433).  ·  1-888-628-9454. This is a hotline for Spanish speakers.  ·  1-800-799-4TTY (1-800-799-4889). This is a hotline for TTY users.  ·  1-866-4-U-TREVOR (1-866-488-7386). This is a hotline for lesbian, gay, bisexual, transgender, or questioning youth.  · Contact a crisis center or a local suicide prevention center. To find a crisis center or suicide prevention center:  · Call your local hospital, clinic, community service organization, mental health center, social service provider, or health department. Ask for assistance in connecting to a crisis center.  · Visit www.suicidepreventionlifeline.org/getinvolved/locator for a list of crisis centers in the [place], or visit www.suicideprevention.ca/thinking-about-suicide/find-a-crisis-centre for a list of centers in [place].  · Visit the following websites:  ·  National Suicide Prevention Lifeline: www.suicidepreventionlifeline.org  ·  Hopeline: www.hopeline.com  ·  American Foundation for Suicide Prevention: www.afsp.org  ·  The Trevor Project (for lesbian, gay, bisexual, transgender, or questioning youth): www.thetrevorproject.org  HOW CAN I HELP MYSELF FEEL BETTER?  · Promise yourself that you will not do anything drastic when you have suicidal feelings. Remember, there is hope. Many people have gotten through suicidal thoughts and feelings, and you will, too. You may  have gotten through them before, and this proves that you can get through them again.  · Let family, friends, teachers, or counselors know how you are feeling. Try not to isolate yourself from those who care about you. Remember, they will want to help you. Talk with someone every day, even if you do not feel sociable. Face-to-face conversation is best.  · Call a mental health professional and see one regularly.  · Visit your primary health care provider every year.  · Eat a well-balanced diet, and space your meals so you eat regularly.  · Get plenty of rest.  · Avoid alcohol and drugs, and remove them from your home. They will only make you feel worse.  · If you are thinking of taking a lot of medicine, give your medicine to someone who can give it to you one day at a time. If you are on antidepressants and are concerned you will overdose, let your health care provider know so he or she can give you safer medicines. Ask your mental health professional about the possible side effects of any medicines you are taking.  · Remove weapons, poisons, knives, and anything else that could harm you from your home.  · Try to stick to routines. Follow a schedule every day. Put self-care on your schedule.  · Make a list of realistic goals, and cross them off when you achieve them. Accomplishments give a sense of worth.  · Wait until you are feeling better before doing the things you find difficult or unpleasant.  · Exercise if you are able. You will feel better if you exercise for even a half hour each day.  · Go out in the sun or into nature. This will help you recover from depression faster. If you have a favorite place to   walk, go there.  · Do the things that have always given you pleasure. Play your favorite music, read a good book, paint a picture, play your favorite instrument, or do anything else that takes your mind off your depression if it is safe to do.  · Keep your living space well lit.  · When you are feeling well,  write yourself a letter about tips and support that you can read when you are not feeling well.  · Remember that life's difficulties can be sorted out with help. Conditions can be treated. You can work on thoughts and strategies that serve you well.     This information is not intended to replace advice given to you by your health care provider. Make sure you discuss any questions you have with your health care provider.     Document Released: 07/21/2002 Document Revised: 02/04/2014 Document Reviewed: 05/11/2013  Elsevier Interactive Patient Education ©2016 Elsevier [employer].  Major Depressive Disorder  Major depressive disorder is a mental illness. It also may be called clinical depression or unipolar depression. Major depressive disorder usually causes feelings of sadness, hopelessness, or helplessness. Some people with this disorder do not feel particularly sad but lose interest in doing things they used to enjoy (anhedonia). Major depressive disorder also can cause physical symptoms. It can interfere with work, school, relationships, and other normal everyday activities. The disorder varies in severity but is longer lasting and more serious than the sadness we all feel from time to time in our lives.  Major depressive disorder often is triggered by stressful life events or major life changes. Examples of these triggers include divorce, loss of your job or home, a move, and the death of a family member or close friend. Sometimes this disorder occurs for no obvious reason at all. People who have family members with major depressive disorder or bipolar disorder are at higher risk for developing this disorder, with or without life stressors. Major depressive disorder can occur at any age. It may occur just once in your life (single episode major depressive disorder). It may occur multiple times (recurrent major depressive disorder).  SYMPTOMS  People with major depressive disorder have either anhedonia or depressed mood  on nearly a daily basis for at least 2 weeks or longer. Symptoms of depressed mood include:  · Feelings of sadness (blue or down in the dumps) or emptiness.  · Feelings of hopelessness or helplessness.  · Tearfulness or episodes of crying (may be observed by others).  · Irritability (children and adolescents).  In addition to depressed mood or anhedonia or both, people with this disorder have at least four of the following symptoms:  · Difficulty sleeping or sleeping too much.    · Significant change (increase or decrease) in appetite or weight.    · Lack of energy or motivation.  · Feelings of guilt and worthlessness.    · Difficulty concentrating, remembering, or making decisions.  · Unusually slow movement (psychomotor retardation) or restlessness (as observed by others).    · Recurrent wishes for death, recurrent thoughts of self-harm (suicide), or a suicide attempt.  People with major depressive disorder commonly have persistent negative thoughts about themselves, other people, and the world. People with severe major depressive disorder may experience distorted beliefs or perceptions about the world (psychotic delusions). They also may see or hear things that are not real (psychotic hallucinations).  DIAGNOSIS  Major depressive disorder is diagnosed through an assessment by your health care provider. Your health care provider will ask about aspects of   your daily life, such as mood, sleep, and appetite, to see if you have the diagnostic symptoms of major depressive disorder. Your health care provider may ask about your medical history and use of alcohol or drugs, including prescription medicines. Your health care provider also may do a physical exam and blood work. This is because certain medical conditions and the use of certain substances can cause major depressive disorder-like symptoms (secondary depression). Your health care provider also may refer you to a mental health specialist for further evaluation  and treatment.  TREATMENT  It is important to recognize the symptoms of major depressive disorder and seek treatment. The following treatments can be prescribed for this disorder:    · Medicine. Antidepressant medicines usually are prescribed. Antidepressant medicines are thought to correct chemical imbalances in the brain that are commonly associated with major depressive disorder. Other types of medicine may be added if the symptoms do not respond to antidepressant medicines alone or if psychotic delusions or hallucinations occur.  · Talk therapy. Talk therapy can be helpful in treating major depressive disorder by providing support, education, and guidance. Certain types of talk therapy also can help with negative thinking (cognitive behavioral therapy) and with relationship issues that trigger this disorder (interpersonal therapy).  A mental health specialist can help determine which treatment is best for you. Most people with major depressive disorder do well with a combination of medicine and talk therapy. Treatments involving electrical stimulation of the brain can be used in situations with extremely severe symptoms or when medicine and talk therapy do not work over time. These treatments include electroconvulsive therapy, transcranial magnetic stimulation, and vagal nerve stimulation.     This information is not intended to replace advice given to you by your health care provider. Make sure you discuss any questions you have with your health care provider.     Document Released: 05/11/2012 Document Revised: 02/04/2014 Document Reviewed: 05/11/2012  Elsevier Interactive Patient Education ©2016 Elsevier Inc.

## 2015-02-23 NOTE — BH Assessment (Addendum)
Tele Assessment Note   Tami Rodriguez is an 20 y.o. female. Pt denies SI/HI. Pt denies AVH. Pt states "I don't want to hurt myself I just don't care about my life." Pt states she has dealt with depression most of her life but it has worsened within the last 2 days. Pt is seen by Dr. Eda Keys and a counselor at [facility]. Pt states she is prescribed Abilify but it has not been effective. Pt denies SA. Pt denies abuse. Pt reports depressive symptoms. Pt states the primary cause of her depression is loneliness. Pt states she has 1 friend and does not have contact with her family. Pt states "I just want to feel better."  Writer consulted with Dr. Dub Mikes. Per Dr. Dub Mikes Pt does not meet inpatient criteria. Pt provided with [facility] contact information.   Diagnosis:  F33.2 MDD, Severe  Past Medical History:  Past Medical History  Diagnosis Date  . Chronic headaches   . Seizures (HCC)     10/24/14 unsure of last sz  . Depression   . Bipolar affect, depressed (HCC)   . Anxiety     Past Surgical History  Procedure Laterality Date  . Appendectomy  2011    Family History:  Family History  Problem Relation Age of Onset  . Stroke Maternal Grandmother     Died in her 97's  . Dementia Paternal Grandmother   . Seizures Cousin   . Healthy Sister   . Healthy Brother   . Healthy Sister     Social History:  reports that she has never smoked. She has never used smokeless tobacco. She reports that she does not drink alcohol or use illicit drugs.  Additional Social History:  Alcohol / Drug Use Pain Medications: Pt denies Prescriptions: Abilify Over the Counter: Pt denies History of alcohol / drug use?: No history of alcohol / drug abuse Longest period of sobriety (when/how long): NA  CIWA: CIWA-Ar BP: 129/84 mmHg Pulse Rate: 108 COWS:    PATIENT STRENGTHS: (choose at least two) Capable of independent living Communication skills  Allergies:  Allergies  Allergen  Reactions  . Tegretol [Carbamazepine] Rash  . Penicillins Rash    Has patient had a PCN reaction causing immediate rash, facial/tongue/throat swelling, SOB or lightheadedness with hypotension: Yes Has patient had a PCN reaction causing severe rash involving mucus membranes or skin necrosis: No Has patient had a PCN reaction that required hospitalization No Has patient had a PCN reaction occurring within the last 10 years: No If all of the above answers are "NO", then may proceed with Cephalosporin use.     Home Medications:  (Not in a hospital admission)  OB/GYN Status:  Patient's last menstrual period was 06/20/2014.  General Assessment Data Location of Assessment: [facility] Services TTS Assessment: In system Is this a Tele or Face-to-Face Assessment?: Tele Assessment Is this an Initial Assessment or a Re-assessment for this encounter?: Initial Assessment Marital status: Single Maiden name: NA Is patient pregnant?: No Pregnancy Status: No Living Arrangements: Non-relatives/Friends Can pt return to current living arrangement?: Yes Admission Status: Voluntary Is patient capable of signing voluntary admission?: Yes Referral Source: Self/Family/Friend Insurance type: SP     Crisis Care Plan Living Arrangements: Non-relatives/Friends Legal Guardian: Other: (self) Name of Psychiatrist: Eda Keys Name of Therapist: Colon Branch- [facility]  Education Status Is patient currently in school?: Yes Current Grade: NA Highest grade of school patient has completed: 12 Name of school: NA Contact person: NA  Risk  to self with the past 6 months Suicidal Ideation: No Has patient been a risk to self within the past 6 months prior to admission? : No Suicidal Intent: No Has patient had any suicidal intent within the past 6 months prior to admission? : No Is patient at risk for suicide?: No Suicidal Plan?: No Has patient had any suicidal plan within the past 6 months  prior to admission? : No Access to Means: No What has been your use of drugs/alcohol within the last 12 months?: NA Previous Attempts/Gestures: No How many times?: 0 Other Self Harm Risks: NA Triggers for Past Attempts: None known Intentional Self Injurious Behavior: None Family Suicide History: No Recent stressful life event(s): Other (Comment) (up and down emotions) Persecutory voices/beliefs?: No Depression: Yes Depression Symptoms: Tearfulness, Isolating, Loss of interest in usual pleasures, Feeling worthless/self pity, Feeling angry/irritable Substance abuse history and/or treatment for substance abuse?: No Suicide prevention information given to non-admitted patients: Not applicable  Risk to Others within the past 6 months Homicidal Ideation: No Does patient have any lifetime risk of violence toward others beyond the six months prior to admission? : No Thoughts of Harm to Others: No Current Homicidal Intent: No Current Homicidal Plan: No Access to Homicidal Means: No Identified Victim: NA History of harm to others?: No Assessment of Violence: None Noted Violent Behavior Description: NA Does patient have access to weapons?: No Criminal Charges Pending?: No Does patient have a court date: No Is patient on probation?: No  Psychosis Hallucinations: None noted Delusions: None noted  Mental Status Report Appearance/Hygiene: In scrubs Eye Contact: Fair Motor Activity: Freedom of movement Speech: Logical/coherent Level of Consciousness: Alert Mood: Depressed, Sad Affect: Depressed, Sad Anxiety Level: Moderate Thought Processes: Coherent, Relevant Judgement: Unimpaired Orientation: Person, Place, Time, Situation, Appropriate for developmental age Obsessive Compulsive Thoughts/Behaviors: None  Cognitive Functioning Concentration: Normal Memory: Recent Intact, Remote Intact IQ: Average Insight: Fair Impulse Control: Fair Appetite: Poor Weight Loss: 0 Weight Gain:  0 Sleep: Decreased Total Hours of Sleep: 5 Vegetative Symptoms: None  ADLScreening [facility] Assessment Services) Patient's cognitive ability adequate to safely complete daily activities?: Yes Patient able to express need for assistance with ADLs?: Yes Independently performs ADLs?: Yes (appropriate for developmental age)  Prior Inpatient Therapy Prior Inpatient Therapy: No Prior Therapy Dates: NA Prior Therapy Facilty/Provider(s): NA Reason for Treatment: NA  Prior Outpatient Therapy Prior Outpatient Therapy: Yes Prior Therapy Dates: 2017 Prior Therapy Facilty/Provider(s): Eda Keys Reason for Treatment: depression, bipolar Does patient have an ACCT team?: No Does patient have Intensive In-House Services?  : No Does patient have [facility] services? : No Does patient have P4CC services?: No  ADL Screening (condition at time of admission) Patient's cognitive ability adequate to safely complete daily activities?: Yes Is the patient deaf or have difficulty hearing?: No Does the patient have difficulty seeing, even when wearing glasses/contacts?: No Does the patient have difficulty concentrating, remembering, or making decisions?: No Patient able to express need for assistance with ADLs?: Yes Does the patient have difficulty dressing or bathing?: No Independently performs ADLs?: Yes (appropriate for developmental age) Does the patient have difficulty walking or climbing stairs?: No Weakness of Legs: None Weakness of Arms/Hands: None       Abuse/Neglect Assessment (Assessment to be complete while patient is alone) Physical Abuse: Denies Verbal Abuse: Denies Sexual Abuse: Denies Exploitation of patient/patient's resources: Denies Self-Neglect: Denies Values / Beliefs Cultural Requests During Hospitalization: None Spiritual Requests During Hospitalization: None   Advance Directives (For Healthcare) Does patient  have an advance directive?: No Would patient like information on  creating an advanced directive?: No - patient declined information    Additional Information 1:1 In Past 12 Months?: No CIRT Risk: No Elopement Risk: No Does patient have medical clearance?: Yes     Disposition:  Disposition Initial Assessment Completed for this Encounter: Yes Disposition of Patient: Referred to ([facility]) Type of inpatient treatment program: Adult  Emmit Pomfret 02/23/2015 5:53 PM

## 2015-02-23 NOTE — ED Notes (Signed)
Telepsych complete

## 2015-02-23 NOTE — ED Provider Notes (Addendum)
CSN: [id number]     Arrival date & time 02/23/15  1445 History   First MD Initiated Contact with Patient 02/23/15 1520     Chief Complaint  Patient presents with  . Depression      HPI Pt has had worsening depression especially over the last few days.  She is not sure why.  Nothing has happened recently.  She has had overwhelming sadness, she has been crying.  She cant sleep but she feels very tired. She has no plans to harm herself but she does not want to live.  She tried to call her doctor but they did not get back to her.  She felt like she needed to come to the hospital.  No prior admissions.  No prior suicide attempt.   Past Medical History  Diagnosis Date  . Chronic headaches   . Seizures (HCC)     10/24/14 unsure of last sz  . Depression   . Bipolar affect, depressed (HCC)   . Anxiety    Past Surgical History  Procedure Laterality Date  . Appendectomy  2011   Family History  Problem Relation Age of Onset  . Stroke Maternal Grandmother     Died in her 85's  . Dementia Paternal Grandmother   . Seizures Cousin   . Healthy Sister   . Healthy Brother   . Healthy Sister    Social History  Substance Use Topics  . Smoking status: Never Smoker   . Smokeless tobacco: Never Used  . Alcohol Use: No   OB History    No data available     Review of Systems  All other systems reviewed and are negative.     Allergies  Tegretol and Penicillins  Home Medications   Prior to Admission medications   Medication Sig Start Date End Date Taking? Authorizing Provider  ARIPiprazole (ABILIFY) 20 MG tablet Take 20 mg by mouth daily.   Yes Historical Provider, MD  lamoTRIgine (LAMICTAL) 100 MG tablet Take 1 tablet (100 mg total) by mouth 2 (two) times daily. 12/06/14  Yes Suanne Marker, MD  ibuprofen (ADVIL,MOTRIN) 800 MG tablet Take 1 tablet (800 mg total) by mouth every 8 (eight) hours as needed for mild pain. Patient not taking: Reported on 02/23/2015 02/20/15   Kristen N  Ward, DO   BP 129/84 mmHg  Pulse 108  Temp(Src) 98.4 F (36.9 C) (Oral)  Resp 20  Ht  (1.575 m)  Wt 89.812 kg  BMI 36.21 kg/m2  SpO2 100%  LMP 06/20/2014 Physical Exam  Constitutional: She appears well-developed and well-nourished. No distress.  HENT:  Head: Normocephalic and atraumatic.  Right Ear: External ear normal.  Left Ear: External ear normal.  Eyes: Conjunctivae are normal. Right eye exhibits no discharge. Left eye exhibits no discharge. No scleral icterus.  Neck: Neck supple. No tracheal deviation present.  Cardiovascular: Normal rate, regular rhythm and intact distal pulses.   Pulmonary/Chest: Effort normal and breath sounds normal. No stridor. No respiratory distress. She has no wheezes. She has no rales.  Abdominal: Soft. Bowel sounds are normal. She exhibits no distension. There is no tenderness. There is no rebound and no guarding.  Musculoskeletal: She exhibits no edema or tenderness.  Neurological: She is alert. She has normal strength. No cranial nerve deficit (no facial droop, extraocular movements intact, no slurred speech) or sensory deficit. She exhibits normal muscle tone. She displays no seizure activity. Coordination normal.  Skin: Skin is warm and dry. No rash noted.  Psychiatric: She is withdrawn. She is not agitated, not aggressive and not hyperactive. She exhibits a depressed mood. She expresses no suicidal plans and no homicidal plans.  Nursing note and vitals reviewed.   ED Course  Procedures (including critical care time) Labs Review Labs Reviewed  COMPREHENSIVE METABOLIC PANEL - Abnormal; Notable for the following:    Glucose, Bld 119 (*)    All other components within normal limits  CBC WITH DIFFERENTIAL/PLATELET  URINE RAPID DRUG SCREEN, HOSP PERFORMED  ETHANOL  PREGNANCY, URINE     MDM   Final diagnoses:  None    Patient presents to the emergency room with complaints of worsening depression, anhedonia and passive suicidal  thoughts without any specific plan of wanting to harm herself.  Patient has no acute medical issues. She is stable for psychiatric evaluation.    Linwood Dibbles, MD 02/23/15 1734  The patient was assessed by the behavioral health team. They did not feel that she required inpatient treatment. He recommended close outpatient follow-up with the [facility] intensivist outpatient treatment center.  At this time there does not appear to be any evidence of an acute emergency medical condition and the patient appears stable for discharge with appropriate outpatient follow up.   Linwood Dibbles, MD 02/23/15 Windell Moment

## 2015-02-23 NOTE — ED Notes (Signed)
Pt placed in purple scrubs and wanded by security. Belongings placed in locker.

## 2015-03-01 ENCOUNTER — Encounter (HOSPITAL_COMMUNITY): Payer: Self-pay

## 2015-03-01 ENCOUNTER — Other Ambulatory Visit (HOSPITAL_COMMUNITY): Payer: BLUE CROSS/BLUE SHIELD | Attending: Psychiatry | Admitting: Licensed Clinical Social Worker

## 2015-03-01 DIAGNOSIS — F332 Major depressive disorder, recurrent severe without psychotic features: Secondary | ICD-10-CM | POA: Insufficient documentation

## 2015-03-01 DIAGNOSIS — F331 Major depressive disorder, recurrent, moderate: Secondary | ICD-10-CM | POA: Insufficient documentation
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
## 2015-03-03 NOTE — Addendum Note (Signed)
Addended by: Coolidge Breeze A on: 03/03/2015 08:12 AM   Modules accepted: Medications

## 2015-03-06 ENCOUNTER — Telehealth (HOSPITAL_COMMUNITY): Payer: Self-pay | Admitting: Licensed Clinical Social Worker

## 2015-03-06 ENCOUNTER — Other Ambulatory Visit (HOSPITAL_COMMUNITY): Payer: Self-pay

## 2015-03-08 ENCOUNTER — Encounter: Payer: Self-pay | Admitting: Diagnostic Neuroimaging

## 2015-03-08 ENCOUNTER — Ambulatory Visit (INDEPENDENT_AMBULATORY_CARE_PROVIDER_SITE_OTHER): Payer: BLUE CROSS/BLUE SHIELD | Admitting: Diagnostic Neuroimaging

## 2015-03-08 VITALS — BP 102/58 | HR 81 | Ht 62.0 in | Wt 203.6 lb

## 2015-03-08 DIAGNOSIS — G40209 Localization-related (focal) (partial) symptomatic epilepsy and epileptic syndromes with complex partial seizures, not intractable, without status epilepticus: Secondary | ICD-10-CM | POA: Diagnosis not present

## 2015-03-08 MED ORDER — LAMOTRIGINE 100 MG PO TABS: 100.0000 mg | ORAL_TABLET | Freq: Two times a day (BID) | ORAL | Status: AC

## 2015-03-08 NOTE — Progress Notes (Signed)
9  [facility]  PATIENT: Tami Rodriguez DOB: 08-Aug-1995  REFERRING CLINICIAN: [facility] (Dr. Neita Carp) HISTORY FROM: patient and EPIC chart review REASON FOR VISIT: follow up     HISTORICAL  CHIEF COMPLAINT:  Chief Complaint  Patient presents with  . Partial symptomatic epilepsy    rm 6, no seizure activity  . Follow-up    3 month    HISTORY OF PRESENT ILLNESS:   UPDATE 03/08/15: Since last visit, no seizures. Mild "funny feeling in head" spell on sofa 2 months ago, but no memory lapse, shaking or confusion. Having some mild speech diff, poss abilify side effect; however abilify medication holiday has not relieved speech diff symptoms.   UPDATE 12/06/14: Since last visit, started LTG, now doing better. Within 1-2 weeks, stopped having sz spells. Also started quetiapine since last visit and doing better. Now with intermittent nausea, early AM, and has missed her period. She states she does not think she is pregnant, but she is sexually active. She has not had period since June 2016.   PRIOR HPI (10/24/14): 20 year old right-handed female here for evaluation of seizure disorder. 2014 patient had onset of episodes of staring spells, distraction, amnesia, confusion, with some generalized "shaking" movements. No tongue biting or incontinence. Patient went to the emergency room after these were witnessed by patient's boyfriend several times. 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 Sharene Ske[facility]). She was started on Lamictal but could not afford due to cost. Carbamazepine was tried but this caused hives and patient went back to Lamictal. Ultimately she was able to afford medication with change in insurance. In 2015 levetiracetam was added to achieve better seizure control, but this was discontinued due to causing "anger spells". In April 2015 patient did not feel well on all these  medications, and then stopped following up with neurology and stopped her antiseizure medications. Over the past 1-1/2 years patient seizures have returned. Over the past 3-4 months she is having increasing episodes, almost on a daily basis, with staring and confusion. Since January 2016, she has developed significant depression and anxiety, including racing thoughts, auditory hallucinations, suicidal thoughts, irregular sleep patterns. She was seeing a psychologist, but now is in the process of transition to a new clinic. Patient lives with her boyfriend currently. She is apparently estranged from her family, and lives a "lonely" life. Patient is able to contract for safety regarding her suicidal thoughts.   REVIEW OF SYSTEMS: Full 14 system review of systems performed and notable only for speech diff insomnia depression anxiety freq waking.    ALLERGIES: Allergies  Allergen Reactions  . Tegretol [Carbamazepine] Rash  . Penicillins Rash    Has patient had a PCN reaction causing immediate rash, facial/tongue/throat swelling, SOB or lightheadedness with hypotension: Yes Has patient had a PCN reaction causing severe rash involving mucus membranes or skin necrosis: No Has patient had a PCN reaction that required hospitalization No Has patient had a PCN reaction occurring within the last 10 years: No If all of the above answers are "NO", then may proceed with Cephalosporin use.     HOME MEDICATIONS: Outpatient Prescriptions Prior to Visit  Medication Sig Dispense Refill  . ARIPiprazole (ABILIFY) 20 MG tablet Take 20 mg by mouth daily. Reported on 03/01/2015    . benztropine (COGENTIN) 1 MG tablet Take 1 mg by mouth daily.    . Melatonin 10 MG TABS Take 10 mg by mouth  Nightly.    . lamoTRIgine (LAMICTAL) 100 MG tablet Take 1 tablet (100 mg total) by mouth 2 (two) times daily. 60 tablet 12  . ibuprofen (ADVIL,MOTRIN) 800 MG tablet Take 1 tablet (800 mg total) by mouth every 8 (eight) hours as  needed for mild pain. (Patient not taking: Reported on 02/23/2015) 30 tablet 0   No facility-administered medications prior to visit.    PAST MEDICAL HISTORY: Past Medical History  Diagnosis Date  . Chronic headaches   . Seizures (HCC)     10/24/14 unsure of last sz  . Depression   . Bipolar affect, depressed (HCC)   . Anxiety     PAST SURGICAL HISTORY: Past Surgical History  Procedure Laterality Date  . Appendectomy  2011    FAMILY HISTORY: Family History  Problem Relation Age of Onset  . Stroke Maternal Grandmother     Died in her 53's  . Schizophrenia Paternal Grandmother   . Seizures Cousin   . Healthy Sister   . Healthy Brother   . Healthy Sister     SOCIAL HISTORY:  Social History   Social History  . Marital Status: Single    Spouse Name: N/A  . Number of Children: 0  . Years of Education: 12   Occupational History  .      [employer]   Social History Main Topics  . Smoking status: Never Smoker   . Smokeless tobacco: Never Used  . Alcohol Use: No  . Drug Use: No  . Sexual Activity:    Partners: Male    Birth Control/ Protection: Condom   Other Topics Concern  . Not on file   Social History Narrative   Lives at home with sig other   Caffeine use- green tea 3 x a week, Red Bull occas     PHYSICAL EXAM  GENERAL EXAM/CONSTITUTIONAL: Vitals:  Filed Vitals:   03/08/15 0857  BP: 102/58  Pulse: 81  Height: 5\' 2"  (1.575 m)  Weight: 203 lb 9.6 oz (92.352 kg)   Body mass index is 37.23 kg/(m^2). No exam data present  Patient is in no distress; well developed, nourished and groomed; neck is supple  CARDIOVASCULAR:  Examination of carotid arteries is normal; no carotid bruits  Regular rate and rhythm, no murmurs  Examination of peripheral vascular system by observation and palpation is normal   MUSCULOSKELETAL:  Gait, strength, tone, movements noted in Neurologic exam below  NEUROLOGIC: MENTAL STATUS:  No flowsheet data  found.  awake, alert, oriented to person, place and time  recent and remote memory intact  normal attention and concentration  language fluent, comprehension intact, naming intact,   fund of knowledge appropriate  CRANIAL NERVE:   2nd, 3rd, 4th, 6th - pupils equal and reactive to light, visual fields full to confrontation, extraocular muscles intact, no nystagmus  5th - facial sensation symmetric  7th - facial strength symmetric  8th - hearing intact  9th - palate elevates symmetrically, uvula midline  11th - shoulder shrug symmetric  12th - tongue protrusion midline  MOTOR:   normal bulk and tone, full strength in the BUE, BLE  SENSORY:   normal and symmetric to light touch, temperature, vibration  COORDINATION:   finger-nose-finger, fine finger movements normal  REFLEXES:   deep tendon reflexes present and symmetric  GAIT/STATION:   narrow based gait; able to walk tandem; romberg is negative    DIAGNOSTIC DATA (LABS, IMAGING, TESTING) - I reviewed patient records, labs, notes, testing and imaging  myself where available.  Lab Results  Component Value Date   WBC 7.4 02/23/2015   HGB 12.9 02/23/2015   HCT 37.5 02/23/2015   MCV 87.6 02/23/2015   PLT 206 02/23/2015      Component Value Date/Time   NA 140 02/23/2015 1550   K 4.1 02/23/2015 1550   CL 106 02/23/2015 1550   CO2 26 02/23/2015 1550   GLUCOSE 119* 02/23/2015 1550   BUN 10 02/23/2015 1550   CREATININE 0.59 02/23/2015 1550   CALCIUM 9.2 02/23/2015 1550   PROT 7.2 02/23/2015 1550   ALBUMIN 4.0 02/23/2015 1550   AST 20 02/23/2015 1550   ALT 21 02/23/2015 1550   ALKPHOS 81 02/23/2015 1550   BILITOT 0.3 02/23/2015 1550   GFRNONAA >60 02/23/2015 1550   GFRAA >60 02/23/2015 1550   No results found for: CHOL, HDL, LDLCALC, LDLDIRECT, TRIG, CHOLHDL No results found for: ZOXW9U No results found for: VITAMINB12 No results found for: TSH   05/20/12 EEG  DESCRIPTION OF FINDINGS:  -  Dominant frequency is 11 Hz, 30 microvolt alpha range activity that attenuates with eye opening. Background activity consists of low-voltage alpha and upper theta range, and frontally predominant beta range components. - During hyperventilation, the patient initially has nonepileptic behavior with nodding her head and movement of her body. This occurs at about 1 minutes and 50 seconds. The patient has sudden onset of 60 V rhythmic alpha range activity over the entire left hemisphere and coincident appearance 5 seconds later of 35-40 microvolt rhythmic theta range activity over the right hemisphere. This lasts for about 9 seconds. The technologist mentions that she has staring on her face but this could not be seen on the video. The patient then has 7 Hz generalized theta range activity, which slows to 5 Hz predominantly over the left hemisphere and then generalized delta range activity. The entire episode lasted for 42 seconds. This involved pages 21 through 26 in the record. On page 103-107. A very similar event starts spontaneously and is associated with rhythmic alpha range activity of 10 Hz over the left hemisphere, and theta and upper delta range activity over the right hemisphere. Then, she has Generalized rhythmic theta range activity followed by rhythmic generalized delta range activity at the end. During this time, there is slight movement of the right leg. Her eyes remain closed.   IMPRESSION: The 2 episodes described above represent electrographic seizures. In the 1st, the technologist said that the patient was staring. In the 2nd, no notation was made concerning the behaviors. 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   07/17/12 MRI brain [I reviewed images myself. There may be slight  decrease in size of left hippocampus compared to right. I do not appreciate significant difference in signal. -VRP]  1. Mild asymmetric increased signal of the right amygdala/hippocampal complex. Favor related to recent seizure activity. 2. Otherwise the MRI appearance of the brain is within normal limits.     ASSESSMENT AND PLAN  20 y.o. 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 Now on lamotrigine  twice a day and no more seizures since Oct 2016.    Dx:  Partial symptomatic epilepsy with complex partial seizures, not intractable, without status epilepticus (HCC)   PLAN: - continue lamotrigine  BID - check lamictal level to rule out  toxicity (patient having mild speech diff)  Orders Placed This Encounter  Procedures  . Lamotrigine level   Meds ordered this encounter  Medications  . lamoTRIgine (LAMICTAL) 100 MG tablet    Sig: Take 1 tablet (100 mg total) by mouth 2 (two) times daily.    Dispense:  60 tablet    Refill:  12   Return in about 6 months (around 09/05/2015).    Suanne Marker, MD 03/08/2015, 10:00 AM Certified in Neurology, Neurophysiology and Neuroimaging  [facility] [address], [address] [place], [place] [address] [phone]

## 2015-03-08 NOTE — Patient Instructions (Addendum)
Thank you for coming to see [place] at [facility]. I hope we have been able to provide you high quality care today.  You may receive a patient satisfaction survey over the next few weeks. We would appreciate your feedback and comments so that we may continue to improve ourselves and the health of our patients.  - continue lamotrigine   ~~~~~~~~~~~~~~~~~~~~~~~~~~~~~~~~~~~~~~~~~~~~~~~~~~~~~~~~~~~~~~~~~  DR. PENUMALLI'S GUIDE TO HAPPY AND HEALTHY LIVING These are some of my general health and wellness recommendations. Some of them may apply to you better than others. Please use common sense as you try these suggestions and feel free to ask me any questions.   ACTIVITY/FITNESS Mental, social, emotional and physical stimulation are very important for brain and body health. Try learning a new activity (arts, music, language, sports, games).  Keep moving your body to the best of your abilities. You can do this at home, inside or outside, the park, community center, gym or anywhere you like. Consider a physical therapist or personal trainer to get started. Consider the app Sworkit. Fitness trackers such as smart-watches, smart-phones or Fitbits can help as well.   NUTRITION Eat more plants: colorful vegetables, nuts, seeds and berries.  Eat less sugar, salt, preservatives and processed foods.  Avoid toxins such as cigarettes and alcohol.  Drink water when you are thirsty. Warm water with a slice of lemon is an excellent morning drink to start the day.  Consider these websites for more information The Nutrition Source (https://www.henry-hernandez.biz/) Precision Nutrition (WindowBlog.ch)   RELAXATION Consider practicing mindfulness meditation or other relaxation techniques such as deep breathing, prayer, yoga, tai chi, massage. See website mindful.org or the apps Headspace or Calm to help get started.   SLEEP Try to get at least  7-8+ hours sleep per day. Regular exercise and reduced caffeine will help you sleep better. Practice good sleep hygeine techniques. See website sleep.org for more information.   PLANNING Prepare estate planning, living will, healthcare POA documents. Sometimes this is best planned with the help of an attorney. Theconversationproject.org and agingwithdignity.org are excellent resources.

## 2015-03-09 ENCOUNTER — Other Ambulatory Visit (HOSPITAL_COMMUNITY): Payer: BLUE CROSS/BLUE SHIELD | Attending: Psychiatry | Admitting: Licensed Clinical Social Worker

## 2015-03-09 DIAGNOSIS — G40909 Epilepsy, unspecified, not intractable, without status epilepticus: Secondary | ICD-10-CM | POA: Insufficient documentation

## 2015-03-09 DIAGNOSIS — F332 Major depressive disorder, recurrent severe without psychotic features: Secondary | ICD-10-CM

## 2015-03-09 DIAGNOSIS — F331 Major depressive disorder, recurrent, moderate: Secondary | ICD-10-CM | POA: Insufficient documentation

## 2015-03-09 DIAGNOSIS — F419 Anxiety disorder, unspecified: Secondary | ICD-10-CM | POA: Insufficient documentation

## 2015-03-09 LAB — LAMOTRIGINE LEVEL: Lamotrigine Lvl: NOT DETECTED ug/mL (ref 2.0–20.0)

## 2015-03-09 NOTE — Psych (Signed)
[facility] BH PHP THERAPIST PROGRESS NOTE  Helene Bernstein [id number]   [facility] BH PHP THERAPIST GROUP NOTE  Date: 03/09/15  Time: 10:30 AM-12:00 PM   Group Topic/Focus:  Group topic related to CBT worksheets on depression that included identification of triggers, identification of distorted thoughts, unhealthy coping skills and developing healthy alternative coping skills  Participation Level:  Active  Participation Quality:  Attentive and Sharing  Affect:  Appropriate  Cognitive:  Appropriate  Insight: Lacking  Engagement in Group:  Engaged  Modes of Intervention:  Activity, Discussion, Education, Exploration, Problem-solving, Rapport Building and CBT skills  Additional Comments: Group topic related to CBT worksheets on depression titled "Identification of Triggers", "Identification of Distorted Thoughts", "Generate List of Unhealthy Go-To Coping Skills", "Recognition of Consequences", and "Developing Alternative Healthy Coping Skills". Group started with an icebreaker titled "Candy Introductions". Group members also had a check in. Therapist discussed how recognizing and becoming more aware of their triggers will help them develop better strategies to manage them and this will help them improve their mood. Patients were able to start becoming more aware of their distorted thoughts that were related to depression and they started to recognize with these distorted thoughts that they were discounting the positive. Patients looked at the negative consequences of their unhealthy coping skills and generated alternative positive coping skills.   Marcheta identified that her lack of control of negative thoughts, thoughts about her family and isolation were triggers. She recognized that her negative thoughts included that she was worthless and that she was not going to be successful. Group members were able to challenge her thoughts but pointing out that she had come this far and therapist was able to get her  to share with the group that she raised her brothers and sisters and that she was bilingual. Taeya was able to verbalize that alternative coping skills were not to isolate, to not be in her her head, to notice her thought to challenge them, and shift more to the positive. This was her first day in group and she related to other group members and participated actively showing good participation.    Plan: 1.Patient learn coping skills that will help her with her depression.   [facility] BH PHP THERAPIST GROUP NOTE  Date: 03/09/15  Time: 12:30 PM-2:00 PM   Group Topic/Focus:  CBT coping strategies for depression including challenging distorted thoughts, cogntiive distortions, identifying core beliefs, identifying healthy beliefs and finding evidence for healthy beliefs  Participation Level:  Active  Participation Quality:  Attentive and Sharing  Affect:  Appropriate  Cognitive:  Appropriate  Insight: Lacking  Engagement in Group:  Engaged  Modes of Intervention:  Activity, Discussion, Education, Exploration, Problem-solving, Rapport Building and CBT Skills  Additional Comments: Group focus related to topic of depression and patients reviewed CBT worksheets titled "Challenging Distorted Thoughts", "Cognitive Distortions", "Downward Arrow To Identify Core Belief", "Identification of Healthy Beliefs", "Rating the Strengths of My Beliefs", and "Historical Evidence Log-Healthy Beliefs". Patients participated in group activity by assisting group members in challenging their distorted thoughts. Patients also were able to uncover some negative core beliefs and, therapist discussed how core beliefs develop. Patients learned that because of how one's filters are set up, one often notices instances that support unhealthy beliefs more that we notice experiences that may support our opposite healthy beliefs. Patients were asked to look at the opposite of their unhealthy belief which was a healthy belief and to look  over their life purposely to see the evidence  that supports their healthy beliefs.   Drisana opened up in the group and shared with the group she had heard voices in the past and this was therapeutic as she was able to open up to the group and other members could relate as well. She said that she wanted to learn coping skills and not just rely on the medication. It took her time to finish worksheet on evidence that she had value but after completing the worksheet she was able to share with the group some of the experiences in her life that she could be proud of. My assessment is that was helpful for her to begin to shift perspective by identifying her skills and strengths and group members feedback was validating and helpful in reinforcing these positive beliefs.   Plan: 1.Tomeca continue to learn coping skills to mange her depression.    Diagnosis: Primary Diagnosis: Severe episode of recurrent major depressive disorder, without psychotic features (HCC) [F33.2]    1. Severe episode of recurrent major depressive disorder, without psychotic features (HCC)       Bowman,[name] 03/09/2015

## 2015-03-09 NOTE — Psych (Signed)
   [facility] BH PHP THERAPIST PROGRESS NOTE  Tami Rodriguez [id number]  Session Time: 9:00AM-10:20AM  Participation Level: Minimal  Behavioral Response: NeatAlertDepressed  Type of Therapy: Psycho-education Group  Treatment Goals addressed: Communication: Creating and Using Crisis Plans and Coping  Interventions: Strength-based, Supportive and Social Skills Training  Summary: Tami Rodriguez is a 20 y.o. female who presents with major depressive disorder. 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 Patient states that she feels lonely and depressed at times due to not having close family or friends. Patient states that she went into the emergency room and was referred to [facility]. Patient states that her goal of group is to get a "better understanding of dealing with depression." Patient checked in and denied feelings of depression today and rates her depression as "mild." Patient states that she slept well at 6 hours ast night.Patient states that her appetite is poor and her ability to concentrate is fair and her energy level is normal. Patient denies feelings of SI at time of check in. Patient declined wanting to speak with case manager or psychiatry.  Patient completed her safety plan indicating her triggers as "feeling lonely and being isolated." Patient reported warning signs as "red face, eating less, becoming very quiet, bouncing legs, crying, not taking care of self, sleeping a lot, isolation, and can't sit still." Patient states that these symptoms sometimes cause her to feel "worthless" if they persist and she is unable to find a suitable intervention. Patient discussed possible interventions as " exercising, taking a hot shower, bouncing a ball, drawing, listening to music, screaming into a pillow,  coloring, writing in a journal, making a collage, getting a hug, hugging a stuffed animal, lying down, and using the gym." Patient states that "being alone and not being listened to" makes things worse. Patient states that she was not aware of so many positive coping skills and interventions and plans to use new coping skills. Patient states that when she handles a potential crisis without making things worse she can reward herself by "getting ice cream." Patient received information to contact [facility], [facility], and the National Suicide Hotline, in the case of a mental health emergency. Patient completed a crisis card and states that she feels that it will be helpful to have access to her coping skills in the case of a crisis. Patient states that she does not have any further questions or concerns. .   Suicidal/Homicidal: Nowithout intent/plan  Therapist Response: LCSW checked in with patient and introduced activity of creating a safety/crisis plan. LCSW provided instructions for creating a safety plan. LCSW provided patient with additional lists of additional coping skills. LCSW provided patient with instructions to create a crisis card. LCSW listened actively and provided supportive feedback to patient.   Plan: Patient will follow crisis/safety plan as needed. Patient will continue to attend PHP program as scheduled.    Diagnosis: Primary Diagnosis: Severe episode of recurrent major depressive disorder, without psychotic features (HCC) [F33.2]    1. Severe episode of recurrent major depressive disorder, without psychotic features (HCC)       Shyheem Whitham, LCSW 03/09/2015

## 2015-03-10 ENCOUNTER — Other Ambulatory Visit (HOSPITAL_COMMUNITY): Payer: BLUE CROSS/BLUE SHIELD | Admitting: *Deleted

## 2015-03-10 DIAGNOSIS — F332 Major depressive disorder, recurrent severe without psychotic features: Secondary | ICD-10-CM

## 2015-03-10 DIAGNOSIS — F331 Major depressive disorder, recurrent, moderate: Secondary | ICD-10-CM | POA: Diagnosis not present

## 2015-03-10 NOTE — Progress Notes (Signed)
[facility] BH PHP THERAPIST GROUP NOTE   Tami Rodriguez  [id number]   Date: 03/10/2015  Session Time: 9:30 am until 12 pm  Group Topic/Focus: Group Therapy  Participation Level: Active   Participation Quality: Attentive, Sharing  and engaged  Affect :Depressed   Insight: Limited  Engagement: Engaged  Cognitive: Alert and Drowsy  Modes of Intervention: Motivational Interviewing, Solution Focused, Strength-based, Family Systems and Other: Agreement re home practice assignment  Summary:  Patient arrived late due to automobile issues yet engaged easily once she arrived. Pt reported minimal sleep (3 hours) due to being in PHP yesterday in addition to multiple errands so that she can relax over two day work break. 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    As Clinical research associate was new to patient introductions were made and focus was on developing rapport and defining patient's objectives. Patient shared her sypmtoms and what led to her being enrolled in partial hospitalization program in addition to her goals. Patient shared family history, current and previous methods of coping with ongoing family dynamics, and negative impact of recent visit with family of origin in [facility].   Patient reports she is invested in increasing her coping skills and time was spent identifying her readiness for change and possible barriers that may present themselves. The first barrier pt shared related to trust issues and conception of trust were explored. Patient was unable to identify what brings, or may bring, her joy as her current routine is work and sleep with little physical activity or social interaction other than with roommate.   Counselor provided psycho education on impact of family system, recovery model of body, mind and spirit in addition to presenting options for weekend home practice. Three options were presented including two individual options and one option that would include talking with  other people. Patient choose to have a home practice of spending time outside daily for the next three days including today. Patient stated her intention to spend 20 minutes outside daily which was double the stated goal of 10 minutes.   Plan: Patient will be in attendance Monday 2/13; report her experience with home practice. Patient will continue to explore  new coping skills.    Diagnosis:       1. Severe episode of recurrent major depressive disorder,  without psychotic features [facility])       Clide Dales, LCSWA 03/10/2015

## 2015-03-13 ENCOUNTER — Other Ambulatory Visit (HOSPITAL_COMMUNITY): Payer: BLUE CROSS/BLUE SHIELD | Admitting: Licensed Clinical Social Worker

## 2015-03-13 ENCOUNTER — Telehealth: Payer: Self-pay | Admitting: *Deleted

## 2015-03-13 DIAGNOSIS — F331 Major depressive disorder, recurrent, moderate: Secondary | ICD-10-CM | POA: Diagnosis not present

## 2015-03-13 DIAGNOSIS — F332 Major depressive disorder, recurrent severe without psychotic features: Secondary | ICD-10-CM

## 2015-03-13 NOTE — Psych (Signed)
[facility] BH PHP THERAPIST PROGRESS NOTE  Ashleyann Shoun [id number]   [facility] BH PHP THERAPIST GROUP NOTE  Date: 03/13/15  Time: 8:00 AM-9:30 AM    Group Topic/Focus:  Discussion of Dr. Lacey Jensen video on primary and secondary emotions  Participation Level:  Active  Participation Quality:  Attentive and Sharing  Affect:  Depressed, Flat and Tearful  Cognitive:  Appropriate  Insight: Limited  Engagement in Group:  Engaged  Modes of Intervention:  Activity, Discussion, Education, Exploration and coping skills for emotions, support  Additional Comments: Group topic related to discussion of Dr. Lacey Jensen video on primary and secondary emotions.  Group started with a check in. The video helped patients gain more insight into their emotions. The video discussed how primary emotions are natural emotions that we all experience and that are body is equipped to manage.  We feel the emotions as a wave and we have no desire to hold onto the emotions. The secondary emotions are reactions to primary emotions and this is where energy blocked and stuck and where we can get stuck spinning in circles. This spinning in circles where energy gets stuck causes the emotional suffering. It is hard sometimes to distinguish between them sometimes but even when we acknowledge we are in secondary emotion we can stop the process from getting worse.  It helps patients to identify when they are in secondary emotion to work their way backwards to what was the trigger for the primary emotion. It is the pushing away and running away from the primary emotion that causes suffering. The goal is to work on stopping ourselves from going into secondary emotion and stay in primary emotion since our body is designed to cope with primary emotion. Therapists discussed how assumptions, the past, anticipation of the future and reaction to primary emotions can trigger secondary emotions.   Jacquelene shared what she wrote from her daily  inventory that she has severe depression and was sad throughout the weekend. She did not feel like coming today but wanted to stay in bed and cry but she knows that coming here will help. She was not alone for most of the weekend and was busy but said that she had no energy and felt "an emotional pain".  Therapist explored this with patient and Henley agrees that one of the main underlying causes is her break with the family and the lack of communication. She can see where she is pushing away her feelings about this and that it has been a source of depression. Therapist explored her feelings and she agrees that there is hurt and grief and loss. She realizes that she has an ideal of her family and that her family falls short of this ideal. Therapist discussed the grief process of gaining new perspective from the loss and coming to acceptance. That might mean accepting her family even though they are the family she had idealized. Patient relates that she knows she has to come to acceptance but she is not ready for what this will be. Therapist also discussed with her finding other sources of support that her family is unable to give her. Patient relates she has gone to church a couple of times but that has not helped. Therapist will continue to work with patient as to underlying causes of depression and working through loss in order to help her to cope with depression.   Plan: 1.Patient gain better insight to emotions and underlying causes of depression that will help her develop  coping strategies for her depression.   [facility] BH PHP THERAPIST GROUP NOTE  Date: 03/13/15 Time: 9:30 AM-10:30 AM    Group Topic/Focus:  Group focus was on Worksheet Related to Core Beliefs  Participation Level:  Active  Participation Quality:  Attentive and Sharing  Affect:  Flat  Cognitive:  Appropriate  Insight: Limited  Engagement in Group:  Engaged  Modes of Intervention:  Activity, Discussion, Education, Exploration  and CBT  Additional Comments: Group topic related to core beliefs. Therapist explained that core beliefs are the very essence of how we see ourselves, other people, the world, and the future.  Core beliefs develop over time, usually from childhood and through the experience of significant life events or particular life circumstances.  Core beliefs are strongly help, rigid, and inflexible beliefs that are maintained by the tendency to focus on information that supports the belief and ignores evidence that contradicts it. Therapist explained that after we identified core beliefs, we need to challenge these unhelpful core beliefs.  We can consider all the experiences that show this beliefs is not true and develop an alternative, balanced core belief. Balanced core beliefs require careful nurturing.  Patients need to affirm themselves by using positive self-statements, remind themselves of all the evidence against the unhelpful core belief and acting against their unhelpful core belief.  The more they do these things, the more they will come to believe their balanced beliefs. Madeeha has identified already the belief that "I am worthless". Therapist connected this underlying belief to her depression. Therapist said that recognizing, challenging, acting against and developing more balanced beliefs will help with her depression.   Plan: 1.Patient learn tools such as CBT strategies to help her manage her emotions   Diagnosis: Primary Diagnosis: Severe episode of recurrent major depressive disorder, without psychotic features (HCC) [F33.2]    1. Severe episode of recurrent major depressive disorder, without psychotic features (HCC)       Bowman,[name] 03/13/2015

## 2015-03-13 NOTE — Progress Notes (Signed)
[facility] BH PHP THERAPIST GROUP NOTE  Stacy Sailer  [id number]  Date: 03/13/2015  Session Time:  10:40 pm - 12:20 pm  Group Topic/Focus: Group Therapy  Participation Level: Active   Participation Quality: Attentive   Affect : Flat & Depressed  Cognitive: Drowsy  Modes of Intervention: DBT, Solution Focused, Supportive, Meditation: Psycho education and Reframing.  Summary:  Co-worker provided Clinical research associate brief update before turning session over to Clinical research associate as patient reported severe depression today.   Patient was provided opportunity to process her weekend experience with depression, actions and thoughts. As patient had no concrete thoughts over the weekend other than 'I'm depressed and I shouldn't be as others have it much worse' we focused on emotional detachment and numbness. Therapist provided psycho education and  normalized patient's experience.   We then practiced exercise of 'self-compassion break' twice; during the two exercises watched Ted Talk on Self Compassion. Before patient left at 12:20 she was given additional self compassion worksheets to use at home and 5 steps for stopping negative (I shouldn't be depressed as others have it so much worse) thoughts.   Diagnosis:  Diagnosis:Primary Diagnosis: Severe episode of recurrent major depressive disorder, without psychotic features (HCC) [F33.2]  1. Severe episode of recurrent major depressive disorder, without psychotic features (HCC)                    [name], LCSW

## 2015-03-13 NOTE — Telephone Encounter (Signed)
LVM requesting call back for lab results. Left this caller's name, number. 

## 2015-03-14 ENCOUNTER — Other Ambulatory Visit (HOSPITAL_COMMUNITY): Payer: BLUE CROSS/BLUE SHIELD | Admitting: Psychiatry

## 2015-03-14 DIAGNOSIS — F332 Major depressive disorder, recurrent severe without psychotic features: Secondary | ICD-10-CM

## 2015-03-14 DIAGNOSIS — F331 Major depressive disorder, recurrent, moderate: Secondary | ICD-10-CM | POA: Diagnosis not present

## 2015-03-14 NOTE — Psych (Signed)
[facility] BH PHP THERAPIST PROGRESS NOTE  Tami Rodriguez [id number]   [facility] BH PHP THERAPIST GROUP NOTE  Date: 03/14/15 Time: 9:00 AM-10:30 AM  Group Topic/Focus:  The group topic related to discussion of the video by Tami Rodriguez from [employer] titled "The Person you Need to [facility]".   Participation Level:  Active  Participation Quality:  Attentive and Sharing  Affect:  Appropriate and Depressed  Cognitive:  Appropriate  Insight: Limited  Engagement in Group:  Engaged  Modes of Intervention:  Activity, Discussion, Education, Exploration and skills to build self-esteem, DBT, skills related to healthy management of relationships  Additional Comments: The group topic related to discussion of the video by Tami Rodriguez from [employer] titled "The Person you Need to [facility]". The group started with a check in. Patients discussed the message of the video which is that you marry yourself through entering a relationship and commitment to yourself. Therapist asks patients to reflect on her comment that she has found that "the biggest challenges are where you have the most to give". The message from the video was that you love yourself for "richer or poorer" which means to recognize that you are whole already and your vow to yourself is to love yourself right where you are. You commit to yourself by being honest with yourself and doing your inner work. You make a commitment for "better or worse" and "sickness and health".  You forgive yourself for your mistakes and that mistakes are only mistakes if you don't learn and grow. You realize that situations and people are in your life to help you develop and you are given what you have asked for though the situations and people put into your life. You are able to comfort yourself and you are the person that you can count on. You make a vow to "have and to hold" which means to love yourself the way that you want another person to love you. You realize that you are  committed to yourself in a relationship until "death do you part". You are not whole until you love yourself. By loving yourself right where you are, you are able to get to where you need to go. When you love yourself, you can love people the way they are. You can love in a new way, you have everything you need and you can light up the part of your world in a new way. The video was helpful for patient in gaining insight to treatment issues of self-esteem and healthy relationships.  Tami Rodriguez said that it would be nice to be able to get there and with practice she hopes to feel the way the speaker did about herself in the video. She recognizes that she has low self-esteem and she needs to work on it. Tami Rodriguez said that she does not know how to take care of herself emotionally and doesn't' know how to engage in positive self-talk and mean it. Therapist pointed out that it takes practice to develop new mental habits and she will work with patient and provide exercises to help her with this. Her mood has improved a little since yesterday but still has severe symptoms of depression. Patient talked about her family and said that she realizes that she has to accept that they are not perfect, but she has to work on letting go of the hurt and anger that she did not get the love she needed because her parents were caught up in their own issues. Tami Rodriguez relates  that it is hard to accept how it happened. Therapist pointed out that it takes a while to work through hurt and grief. It also means that she has to learn to become a parent to herself. Also, therapist discussed DBT approach where healing comes through accepting people and oneself and at the same time working on change to help work through issues and make progress.   Plan: 1.Patient will work on Art therapist to develop self-esteem that will also help with depression.2.Patient will work through feelings of hurt and anger related to relationship with parents and also apply  DBT model of acceptance and change.   [facility] BH PHP THERAPIST GROUP NOTE  Date: 03/14/15   Time: 10:30 AM-12:00 PM   Group Topic/Focus:  Group topic related to worksheet "Create the Habit of Core-Affirming Thoughts" and "The Basics of Human Worth". T  Participation Level:  Active  Participation Quality:  Attentive and Sharing  Affect:  Appropriate and Depressed  Cognitive:  Appropriate  Insight: Limited  Engagement in Group:  Engaged  Modes of Intervention:  Activity, Discussion, Education and skills for building self-esteem, coping skills  Additional Comments: Group topic related to worksheet "Create the Habit of Core-Affirming Thoughts" and "The Basics of Human Worth". Therapist explained to Tami Rodriguez that the skill building activity will help her to practice in thinking the uplifting and self-affirming thoughts that build and preserve self-esteem. She is to sit and meditate on 23 statements of self-esteem each day to work on developing new habits of self-affirming thoughts. Therapist discussed the foundation of self-esteem as recognizing our unconditional self-worth. ".  Therapist discussed from first worksheet that if we base our worth on externals then we make our worth vulnerable to life events.  Tami Rodriguez related that the worksheet gave her a new way to look at things. The worksheet had different reasons that a person has self-worth and she liked the point where a person has worth due to past contributions. The worksheet describes that if one ever contributed to the well-being of others or self-in any way-then that person is not worthless. Tami Rodriguez relates to this in her own life as she raised her brothers and sisters.   Plan: 1.Patient will practice skill building activity from worksheet to sit in a quiet place and to repeat daily statements that will help build thoughts of self-esteem.   [facility] BH PHP THERAPIST GROUP NOTE  Date: 03/14/15   Time: 12:30 PM-2:00 PM   Group Topic/Focus: Group  topic related to worksheet titled "Self-Talk and Affirmations for Self-Esteem" and "The Daily Record of Dysfunctional Thoughts".  Participation Level:  Active  Participation Quality:  Attentive and Sharing  Affect:  Appropriate and Depressed  Cognitive:  Appropriate  Insight: Lacking  Engagement in Group:  Engaged  Modes of Intervention:  Activity, Discussion, Education, Exploration and CBT  Additional Comments: Group topic related to worksheet titled "Self-Talk and Affirmations for Self-Esteem" and "The Daily Record of Dysfunctional Thoughts".  Patients discussed what they learned from the worksheet that what you tell yourself, contribute in an obvious and literal way to one's self-esteem. They can raise their self-esteem simply by working on changing their self-talk and basic beliefs about themselves. The critic and the victim are the most potentially destructive to one's self-esteem.  Tami Rodriguez recognizes both these voices in herself when they were described. The critic's function was described as talking one down into feeling inadequate, inferior and incompetent.  The victim self-talk may add further self-disparaging remarks by telling you that you are  hopeless and powerless. Therapist asked patients to pay special attention to what these subpersonalities are telling them. Patients were guided to follow the following steps when they catch themselves engaging in self-critical or self-victimizing inner dialogues. First to disrupt the chain of negative thoughts with some method that diverts their attention away from their mind and helps them to be more in touch with their feelings and body.  The point is to do something that slows them down and gives them a bit of distance from their negative thoughts.  The second step is to challenge their negative self-talk with appropriate questioning. For example, what's the evidence for this?  Finally, patients need to counter their negative inner dialogue with  positive self-supportive statements. Therapist also introduced the daily record of dysfunctional thoughts and asks patients to track the occurrence of negative self-statements as the occur during a two-week period of time.   Plan: 1.Patient will use the daily record of dysfunctional thoughts to track occurrences of negative self-statements and counter with rational responses.    Diagnosis: Primary Diagnosis: Severe episode of recurrent major depressive disorder, without psychotic features (HCC) [F33.2]    1. Severe episode of recurrent major depressive disorder, without psychotic features (HCC)       [facility] 03/14/2015

## 2015-03-14 NOTE — Telephone Encounter (Signed)
LVM requesting call back; left this caller's name, number.

## 2015-03-15 ENCOUNTER — Other Ambulatory Visit (HOSPITAL_COMMUNITY): Payer: BLUE CROSS/BLUE SHIELD | Admitting: Licensed Clinical Social Worker

## 2015-03-15 DIAGNOSIS — F331 Major depressive disorder, recurrent, moderate: Secondary | ICD-10-CM | POA: Diagnosis not present

## 2015-03-15 DIAGNOSIS — F332 Major depressive disorder, recurrent severe without psychotic features: Secondary | ICD-10-CM

## 2015-03-15 NOTE — Psych (Signed)
[facility] BH PHP THERAPIST PROGRESS NOTE  Tami Rodriguez [id number]   [facility] BH PHP THERAPIST GROUP NOTE  Date: 03/15/15 Time: 9:00 AM-10:00 AM  Group Topic/Focus:  CBT-Automatic Thoughts and Categories of Thought Distortions  Participation Level:  Active  Participation Quality:  Appropriate  Affect:  Appropriate  Cognitive:  Appropriate  Insight: Improving  Engagement in Group:  Engaged  Modes of Intervention:  Activity, Discussion, Education, Exploration and CBT skills  Additional Comments: Group topic was on self-esteem and how negative depression thought patterns can erode one's sense of worth and affect our moods. Group started with ice breaker and check in. Patients learned that cognitive therapy provides an effective, straightforward way to eliminate these self-destructive thoughts and replace them with more reasonable thoughts. Patient learned the model developed by cognitive therapists where most people think that it is the upsetting event that causes are feelings, such as worthlessness or depression.  In reality it is our self-talk that has the greater influence.  Whenever an upsetting event occurs, automatic thoughts(ATs) run through our minds.  Distorted ATs occur so rapidly that we hardly notice them, let alone stop to question them.  You need to learn to catch these distortions, challenge their logic, and replace them with thoughts that more closely align with reality instead of thoughts that depress. Therapist introduced categories of distortions that affect mood and or sense of worth. Patients identified which categories of thought distortions they typically have and ways that they could challenge these thoughts.   Tami Rodriguez reports depression improved today. She has only slept one hour prior to coming to group but she presented with positive participation by engaging in activities. She related to the group that she feels she is getting help as she is learning coping skills. She is  starting to break down thoughts in order to recognize automatic thoughts. She said she wants to work on a stronger foundation so she believes the thoughts. In activity patient identified categories of cognitive distortions that she has including assuming, should, the fairy tale fantasy, labeling, catastrophizing, in certain ways comparisons, blaming, and to a certain extent all or nothing thinking and overgeneralizing.    Plan: 1.Patient start to recognize and challenge types of cognitive distortions to improve mood and self-esteem.   [facility] BH PHP THERAPIST GROUP NOTE  Date: 03/15/15  Time: 10:00 AM-11:00 AM  Group Topic/Focus:  Group topic related to practical applications for principles of CBT through use of a daily thought record.  Participation Level:  Active  Participation Quality:  Attentive and Sharing  Affect:  Appropriate  Cognitive:  Appropriate  Insight: Improving  Engagement in Group:  Engaged  Modes of Intervention:  Activity, Discussion, Education, Exploration, Problem-solving and relationship skills, CBT skills  Additional Comments: Group topic related to practical applications for principles of CBT through use of a daily thought record.  After knowing about distortions, therapist discussed how the next step is to use them to help improve self-esteem and mood. Patients learned that when stressed or depressed, thoughts and feelings can feel overwhelming and putting them on paper helps them to sort it out and seem things more clearly. They discussed how a daily thought record is a tool that could help them to track their thoughts when feeling sad, anxious, guilty, frustrated, etc. Therapist taught patients how to fill out chart and then have patients give an example from their own life of an upsetting event so they would see how this tool could be helpful in improving depression and  self-esteem. Tami Rodriguez used the example of blaming her parents for things that have happened in their  family. She was able to challenge these thoughts but realizing that like catastrophizing, it tends to makes [place] think of ourselves as helpless victims who are powerless to cope.  The antidote is to acknowledge outside influences but to take responsibility our own welfare. Patient recognizes that she does not want to stay in a victim role and she realizes that people are more complex so that it is more helpful to assign a more realistic amount of responsibility to her parents.    Plan: 1.Patient continue to learn and apply tools such as the Daily Thought Record to help with self-esteem and improve mood.   [facility] BH PHP THERAPIST GROUP NOTE  Date: 03/15/15 Time: 12:30 PM-2:00 PM   Group Topic/Focus:  Identifying and Challenging Core Beliefs  Participation Level:  Active  Participation Quality:  Attentive and Sharing  Affect:  Appropriate  Cognitive:  Appropriate  Insight: Improving  Engagement in Group:  Engaged  Modes of Intervention:  Activity, Discussion, Education, Exploration, Support and CBT Skills  Additional Comments: The group focus was on teaching patients about core beliefs. Patients learned that core beliefs are deeply help beliefs.  Because they are learned early in life, they are rarely challenged.  Patient were taught how to uncover core belief, introduced to some common unproductive core beliefs, asked identify those that they hold, and then try to dispute them. Patient identified unproductive core beliefs that included "It's bad to think well of myself","I can't be happy unless a certain condition-like success, money, love, approval, or perfect achievement-is met", "I'm entitled to happiness without having to work for it","One day when i make it, I'll have friends and be able to enjoy myself","The past makes me unhappy. There's no way around it", "I'm only as good as the work I do. If I'm not productive, I'm no good", "If I try hard enough, all people will like me." Patient was  assisted by group to challenge these core beliefs. Therapist connected unproductive beliefs as sources for low self-esteem and mood issues.    Plan: 1.Patient continue to learn and apply tools that will help with mood and self-esteem.        Diagnosis: Primary Diagnosis: Severe episode of recurrent major depressive disorder, without psychotic features ([place]) [F33.2]    1. Severe episode of recurrent major depressive disorder, without psychotic features ([place])       Larell Baney A 03/15/2015

## 2015-03-16 ENCOUNTER — Other Ambulatory Visit (HOSPITAL_COMMUNITY): Payer: Self-pay

## 2015-03-16 NOTE — Progress Notes (Signed)
2/14/ 17   Admit Note to Partial Hospital Program  Duration 45  minutes   CC - " depression"  HPI -  Patient is a20 year old single female.Patient reports history of depression and states her mood had been " getting worse ", with worsening depression and neurovegetative symptoms of depression to include  Poor sleep, poor appetite, low energy level, decreased ability to concentrate, and some anhedonia. Also endorses decreased sense of self esteem.  Denies psychotic symptoms and denies suicidal ideations. Because of worsening depression had recently gone to the ED, not admitted, but referred to our program.    Past Psychiatric History- she reports  a history of depression and anxiety,  No prior psychiatric medications, no history of suicide attempts, no current symptoms of psychosis but states that in the past she has had some hallucinations, no history of mania . In addition to depression, also endorses significant anxiety, described as excessive worrying and some agoraphobia.  Denies history of PTSD. In the past she has been prescribed Seroquel, Abilify, Tegretol, Keppra, Lamictal  ( has a history of seizure disorder, and some of these medications have been prescribed for seizure management )   Substance Abuse History- does not endorse drug or alcohol abuse   Medical History - endorses a history of seizure disorder, she has an outpatient neurologist, Dr. Lajuana Matte ( unsure of spelling ) . No medical illnesses, denies pregnancy, allergic to PCN and Tegretol.  Does not smoke cigarettes .   Social History- single, no children, lives with a friend , employed ( third shift ), HS graduate, no legal issues .    Family History-Parents alive, live together- states one stressor is that she has a poor relationship with her parents, with little current communication. Has two sisters and one brother .  ROS- denies headache , denies visual symptoms,  denies chest pain, denies SOB, Denies coughing  , denies nausea or vomiting, denies fever or chills, denies skin rash. Other systems negative   Current Medications-    Patient reports she is on Lamictal 100 mgrs BID, Abilify 15 mgrs QDAY, Melatonin- unsure of dose, and has been prescribed Cogentin on a PRN basis for possible EPS ( unsure of dose ) .   Denies medication side effects.   MSE : alert and attentive, well groomed, good eye contact,  speech soft , mood described as partially improved but still depressed, affect constricted but reactive, and does smile at times appropriately , Thought process is  linear .She denies any suicidal ideations , denies any self injurious ideations , denies any HI,  no hallucinations , no delusions expressed . Oriented x 3   Assessment - patient is a 20 year old singe female, who presents with history of depression and anxiety . She reports worsening depression, sadness, some neuro-vegetative symptoms of depression. Does not identify any clear triggers for worsening mood. Also describes anxiety, mainly a tendency to worry. She has a history of seizure disorder, and is on Lamictal. She is also being treated with Abilify ( of note, patient does report a prior history of psychotic symptoms- no current psychotic symptoms noted or reported). She denies medication side effects .  Denies any current SI.    Dx- Major Depression , Recurrent, Moderate . No psychotic features at this time . Consider GAD .  History of Seizure Disorder .  Plan - Admit to PHP- this level of care warranted due to severity of symptoms   Continue medications as highlighted above. At  this time does not need scripts, patient to bring medications on next visit in order to review doses .  Patient to follow up with her Neurologist for Seizure Disorder management. Reviewed that she should only drive if cleared /approved by her Neurologist .   Does not need refill at this time.  Order TSH, and consider adding antidepressant  medication, such as Zoloft , to regimen for management of depression. However, as reviewed with patient, will defer this until she is more established in [facility] and mood symptoms monitored by staff    Nehemiah Massed, MD

## 2015-03-16 NOTE — Addendum Note (Signed)
Addended by: Nehemiah Massed A on: 03/16/2015 06:27 PM   Modules accepted: Orders, SmartSet

## 2015-03-17 ENCOUNTER — Other Ambulatory Visit (HOSPITAL_COMMUNITY): Payer: BLUE CROSS/BLUE SHIELD | Admitting: *Deleted

## 2015-03-17 DIAGNOSIS — F331 Major depressive disorder, recurrent, moderate: Secondary | ICD-10-CM | POA: Diagnosis not present

## 2015-03-17 DIAGNOSIS — F332 Major depressive disorder, recurrent severe without psychotic features: Secondary | ICD-10-CM

## 2015-03-17 NOTE — Progress Notes (Signed)
[facility] BH PHP THERAPIST GROUP NOTE  Tami Rodriguez  [id number]   Date: 03/17/2015  Session Time: 9 - 11am  Group Topic/Focus: Psycho-education Group  Participation Level: Active   Participation Quality: Engageable  Affect :Depressed  Cognitive: Alert and Drowsy   Modes of Intervention: Motivational Interviewing, Solution Focused, Psychosocial Skills: Role pPay and Reframing  Summary: Patient easily engages with facilitator when asked to and was able to process differences in how she accepts her own depression as opposed how she would treat one of her siblings with similar symptoms.   Patient was partially attentive to psycho education on thoughts and integration of wise mind and emotion mind with goal of 'wise mind.' At times patient appeared to be drowsy as evidenced by rubbing of eyes and need to stand. Patient responded well to change of situation as we moved group out to patio area.  Patient engaged in first role play experience yet chose not to participate in others. Patient reported the change was refreshing and able to describe the refreshing change by use of the 'five senses tool.' Patient was able to share her experience with self soothing and reports she continues to use especially at bedtime.    Patient agreed to home practice of:  1.Daily writing of acceptance statement (I love and accept myself despite this depression I am experiencing') at beginning of each day Friday and Saturday and then on Sunday at beginning and end of each day.  2. Daily practice of breathing exercise: Breathing 'wise' in and 'mind' for one minute or longer each day 3. Spending 15 mins minimum outside daily  Patient installed contact information for area mobile crisis number in her cell phone.     Diagnosis: Primary Diagnosis: Major Depression, severe, recurrent without psychotic features  Plan: Continue with PHP IOP program; will attend group week of Feb 20 - 24.   [name], LCSW

## 2015-03-17 NOTE — Psych (Signed)
   [facility] BH PHP THERAPIST PROGRESS NOTE  Yanique Mulvihill [id number]   [facility] BH PHP THERAPIST GROUP NOTE  Date: 03/17/15   Time: 8:00 AM-9:00 AM    Group Topic/Focus:  DBT Skill-Mindfulness  Participation Level:  Active  Participation Quality:  Attentive and Sharing  Affect:  Appropriate  Cognitive:  Appropriate  Insight: Limited  Engagement in Group:  Engaged  Modes of Intervention:  Activity, Discussion, Education, Exploration and CBT, DBT  Additional Comments: Topic of the group was discussing mindfulness and how it can be useful to managing symptoms. Group started with a check in. Therapist reviewed with patients that mindfulness is the ability to be aware of your thoughts, emotions, physical sensations and actions-in the present moment-without judging or criticizing yourself or your experience. Patients learned then when we enter the state of awareness or mindfulness, we create a necessary space between the observing Self and what happens around and inside [place]. You can immerse yourself in the experience but not carried away with it. Patients learned that mindfulness skills are important because it can help you focus on one thing in the present moment, and by doing this they can better control and soothe their overwhelming emotions. Mindfulness will help them to learn to identify and separate judgmental thoughts from the experiences.  These judgmental thoughts often fuel their overwhelming emotions.  Patients also learned about wise mind and that mindfulness can help them to develop this skill.    Kynedi related that yesterday she was very depressed and did not come to group but today she feels a little better. She described how she felt yesterday and said she thought to herself "today is an awful day to live".  She was sluggish and did not feel like doing anything.  She is trying to appreciate the things she has and reframe her thoughts but it does not always work. She is not motivated and she  thought to herself that "what I am doing doesn't matter". Patient was unable to identify any specific trigger for the depression. Therapist said that learning DBT skills such as mindfulness skills will give her a set of skills that will help manage better her overwhelming emotions. Therapist also said that learning skills in conjunction with an antidepressant may help elevate her mood. Therapist pointed out that she was using a cognitive distortion by saying "I should be happier". This does not help her to feel better but because it is making demands on herself that because she is not that way ends up causing her to feel inadequate and hopeless. Therapist explained that being less critical of herself and when she practices more self-compassion and acceptance of how she feels this will be more helpful to herself. Therapist explained that using the skill of mindfulness she can observe thoughts and feelings without trying to suppress them. Mindfulness also requires that a person does not become over-identified with thoughts and feelings so that we are not caught up and swept away by them.    Plan: 1.Chandria continue to learn and apply skills that will help control and soothe overwhelming emotions.        Diagnosis: Primary Diagnosis: Severe episode of recurrent major depressive disorder, without psychotic features ([facility]) [F33.2]    1. Severe episode of recurrent major depressive disorder, without psychotic features ([facility])       Bowman,[name] 03/17/2015

## 2015-03-20 ENCOUNTER — Other Ambulatory Visit (HOSPITAL_COMMUNITY): Payer: Self-pay

## 2015-03-20 NOTE — Telephone Encounter (Signed)
Spoke with patient and informed her that her lab blood level of Lamictal last week was 'undetectable'. She stated that she "sometimes forgets to take her medication and had not taken it the day she had lab drawn. She stated "I know I need to do better, and I'm trying."  She then stated she is seeing a "new psychiatrist, Dr Sallyanne Havers, and he wants a letter from Dr Marjory Lies stating that Dr Marjory Lies knows the patient is driving". She stated Dr Jama Flavors does not want to treat her until he has that letter from Dr Marjory Lies. Informed patient his RN will route her request to Dr Marjory Lies. She verbalized understanding, appreciation.

## 2015-03-20 NOTE — Telephone Encounter (Signed)
LVM requesting call back.

## 2015-03-20 NOTE — Telephone Encounter (Signed)
No driving until seizure free x 6 months. Last seizure was in Oct 2016, so no driving until April 1610. Also, she needs to take her medication regularly. -VRP

## 2015-03-21 ENCOUNTER — Other Ambulatory Visit (HOSPITAL_COMMUNITY): Payer: Self-pay

## 2015-03-21 NOTE — Telephone Encounter (Signed)
LVM requesting call back. Left this caller's name, number. 

## 2015-03-22 ENCOUNTER — Other Ambulatory Visit (HOSPITAL_COMMUNITY): Payer: BLUE CROSS/BLUE SHIELD | Admitting: Psychiatry

## 2015-03-22 DIAGNOSIS — F333 Major depressive disorder, recurrent, severe with psychotic symptoms: Secondary | ICD-10-CM

## 2015-03-22 DIAGNOSIS — F331 Major depressive disorder, recurrent, moderate: Secondary | ICD-10-CM | POA: Diagnosis not present

## 2015-03-22 MED ORDER — SERTRALINE HCL 50 MG PO TABS: 50.0000 mg | ORAL_TABLET | Freq: Every day | ORAL | Status: DC

## 2015-03-22 MED ORDER — SERTRALINE HCL 50 MG PO TABS
50.0000 mg | ORAL_TABLET | Freq: Every day | ORAL | Status: DC
  Filled 2015-03-22: qty 1

## 2015-03-22 NOTE — Psych (Signed)
[facility] BH PHP THERAPIST PROGRESS NOTE  Tami Rodriguez [id number]   [facility] BH PHP THERAPIST GROUP NOTE  Date: 03/22/15   Time: 9:00 AM-10:30 AM   Group Topic/Focus:  Discussion of video emotional intelligence  Participation Level:  Active  Participation Quality:  Attentive and Sharing  Affect:  Appropriate  Cognitive:  Appropriate  Insight: Improving  Engagement in Group:  Engaged  Modes of Intervention:  Activity, Discussion, Education, Limit-setting, Problem-solving, Support and coping skills, DBT  Additional Comments: Group topic related to video on emotional intelligence. Group started with ice breaker and check in. Patients discussed information from the video including that IQ is not everything and that emotional intelligence is an important ability in determining how we do in life. Emotional Intelligence is made up of self-awareness, self-management, self-motivation, empathy and social skills. Patients also discussed some of the suggestions in the video given to improve emotional intelligence including "the Ventilation Fallacy", and not to ruminate but to distract. There were suggestions for managing sadness including aerobic exercise, easy successes, reframe, and helping others. The video discussed how to criticize in ways that are helpful and emotional contagion and how emotions can be contagious. Patients agreed that this type of knowledge of understanding and managing emotions gets missed in their education. Therapist asked patients ways that they could improve their emotional intelligence.  Tami Rodriguez checked in by stating that loneliness is a trigger so that distracting herself by staying busy helps.  She works on stopping her negative thoughts and challenging her thoughts. She says she pushes herself but sill has low energy, she feels flat and still not able to find joy in her life. During discussion she shared about her upbringing and her loss of not having the nurturing family that she  had hoped for. Rather she became the parent. My impression is that this is therapeutic to discuss underlying issues of depression to help her work through feelings. Therapist discussed how DBT concept of acceptance will help as well to not stay stuck in blame but help her to move on. Therapist also pointed out that she will need to work on being nurturing to herself to make up for her parents. Tami Rodriguez recognized coping strategies she is using that were discussed in the video such as reframing and not ruminating.    Plan: 1.Patient work on ways to improve emotional intelligence by doing such things as becoming more self-aware and implementing skills to manage depression.   [facility] BH PHP THERAPIST GROUP NOTE  Date: 03/22/15  Time: 10:45 AM-12:00 PM    Group Topic/Focus:  Group topic related to discussion of Dr. Lacey Jensen video on primary and secondary emotions.   Participation Level:  Active  Participation Quality:  Appropriate  Affect:  Appropriate  Cognitive:  Appropriate  Insight: Improving  Engagement in Group:  Engaged  Modes of Intervention:  Activity, Discussion, Education, Exploration, Problem-solving and coping skills  Additional Comments: Group topic related to discussion of Dr. Lacey Jensen video on primary and secondary emotions.  Group started with a check in. The video helped patients gain more insight into their emotions. The video discussed how primary emotions are natural emotions that we all experience and that are body is equipped to manage.  We feel the emotions as a wave and we have no desire to hold onto the emotions. The secondary emotions are reactions to primary emotions and this is where energy blocked and stuck and where we can get stuck spinning in circles. This spinning in circles  where energy gets stuck causes the emotional suffering. It is hard sometimes to distinguish sometimes but even when we acknowledge we are in secondary emotion we can stop process from  getting worse.  It helps patients to identify when they are in secondary emotion to work their way backwards to what was the trigger for the primary emotion. It is the pushing away and running away from the primary emotion that causes suffering. The goal is to work on stopping ourselves from going into secondary emotion and stay in primary emotion since our body is designed to cope with primary emotion. Therapists discussed how assumptions, the past, anticipation of the future and reaction to primary emotions can trigger secondary emotions.   Tami Rodriguez recognizes that the emotion she avoids is sadness and this video helps her to see that pushing away of this emotion causes it to be worse. Patient related that the video help her to understand that she has to go through the emotion.   Plan: 1.Patient use insights to identify triggers, express emotions in healthy ways and learn to stop avoiding emotions.   [facility] BH PHP THERAPIST GROUP NOTE  Date: 03/22/15  Time: 12:30 PM-2:00 PM   Group Topic/Focus:  Therapist reviewed worksheet on distress tolerance skills.  Participation Level:  Active  Participation Quality:  Appropriate  Affect:  Appropriate  Cognitive:  Appropriate  Insight: Improving  Engagement in Group:  Engaged  Modes of Intervention:  Activity, Discussion, Education, Exploration, Problem-solving and DBT Skills  Additional Comments: Therapist reviewed worksheet on distress tolerance skills. Therapist reviewed the concept of radical acceptance and that discussed the concept of radical acceptance and that being overly judgmental of a situation or overly critical of yourself often leads to more pain, missed details and paralysis. Therapist asked patients if they could apply this attitude to something going on in their life. Therapist then reviewed and discussed different distress tolerance skills that were explained in more detail in the worksheet. The distraction skills included distracting  themselves with pleasurable activities, by paying attention to someone else, distract one's thoughts, distract by leaving, distract with tasks and chores, and distract by counting.  Patients also discussed the distress tolerance skill of self-soothing using the five senses. The patient discussed ones that were particularly effective for them. This exercise was helpful for patient to see ways that she can distract and that the purpose is to calm herself until her emotions have calmed down but to not to avoid and not deal with a situation.     Plan: 1.Patients develop a distress tolerance plan and incorporate a plan for daily activity.2.Patients practice the concept of radical acceptance into smaller events in their life.    Diagnosis: Primary Diagnosis: Severe episode of recurrent major depressive disorder, with psychotic features (HCC) [F33.3]    1. Severe episode of recurrent major depressive disorder, with psychotic features (HCC)       Rodriguez,Tami A 03/22/2015

## 2015-03-22 NOTE — Telephone Encounter (Signed)
Patient is returning your call.  

## 2015-03-22 NOTE — Telephone Encounter (Signed)
Spoke with patient and informed her that Dr Marjory Lies stated no driving until seizure free which will be April 2017 per her last reported seizure . Advised she must be diligent in taking her anti seizure medication for her own health/safety as well as others, strongly reiterated these points with patient. She stated she is seeing Dr Jama Flavors today. Advised her that if he needs to speak with Dr Marjory Lies, he should call office directly himself. Patient has office number. She verbalized understanding, appreciation.

## 2015-03-23 ENCOUNTER — Other Ambulatory Visit (HOSPITAL_COMMUNITY): Payer: BLUE CROSS/BLUE SHIELD | Admitting: Licensed Clinical Social Worker

## 2015-03-23 DIAGNOSIS — F332 Major depressive disorder, recurrent severe without psychotic features: Secondary | ICD-10-CM

## 2015-03-23 DIAGNOSIS — F331 Major depressive disorder, recurrent, moderate: Secondary | ICD-10-CM | POA: Diagnosis not present

## 2015-03-23 NOTE — Progress Notes (Addendum)
Expand All Collapse All   02/22 /17 PHP Psychiatric Follow Up Note  Duration 25 minutes   Dx-Major Depression, Recurrent, No psychotic features, history of Seizure Disorder   Subjective : Patient reports some improvement of mood, less depressed, although still reports some sadness and anhedonia. Denies any suicidal ideations. No recent seizures. Denies medication side effects.   Objective : As discussed with Ms. Bowman, therapist, patient  seems to be benefiting from [facility], and mood has improved partially. Patient has identified family stressors and lack of social support as major stressors. She has reported being disconnected from her parents.   Patient reports partially improved mood, but does report some chronic and ongoing depression. At this time presents with fuller range of affect compared to initial presentation and does smile at times appropriately. She feels she is benefiting from [facility] support and states " I am learning about better ways to cope" Energy level remains decreased, patient struggles with sense of self esteem, and reports some ongoing anhedonia. Denies SI. No psychotic symptoms. 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 She is tolerating medications well- she is agreeing to an antidepressant trial.  No akathisia, no abnormal involuntary movements. Have reviewed medication side effects   ROS- no  Recent seizures, denies headache, no chest pain or SOB at this time, no nausea, no vomiting, no rash   MSE - at this time alert , attentive, well groomed, well related, good eye contact , speech normal, reports partially improved depression, affect remains constricted but is more  reactive and smiles at times appropriately  . Thought process linear. No SI , no HI, denies self injurious  ideations ,denies hallucinations . No  delusions expressed  0x3   Current medications- Abilify 15 mgrs QDAY  , Lamictal 100 mgrs BID, Melatonin 10 mgrs QDAY     Assessment - patient reports partially  improved mood  And does present with improved range of affect . Does report ongoing depression and has persistent  neuro-vegetative symptoms . Denies any  SI, no psychotic symptoms.  Family stressors, lack of family contact /support is identified by patient as major stressor. History of seizure disorder, no recent seizures .  Denies medication side effects- agrees to antidepressant trial.     Plan - Continue PHP.  Continue ABILIFY 15 mgrs QDAY  Start ZOLOFT 50 mgrs QDAY  # 15 , one refill- side effects discussed, to include risk of increased suicidal ruminations early in treatment with antidepressants in young adults. Ordered labs- TSH, HgbA1C, Lipid Panel  Patient follows with Dr. Lajuana Matte for management of seizure disorder- patient advised not to drive or operate machinery unless specifically cleared by Neurologist- patient state she has discussed this with Neurologist .     Nehemiah Massed, MD

## 2015-03-23 NOTE — Psych (Signed)
[facility] BH PHP THERAPIST PROGRESS NOTE  Tami Rodriguez [id number]   [facility] BH PHP THERAPIST GROUP NOTE  Date: 03/23/15  Time: 9:00 AM-10:30 AM   Group Topic/Focus:  Learning about Mindfulness and a Guided Mindfulness Activity  Participation Level:  Active  Participation Quality:  Appropriate and Sharing  Affect:  Appropriate  Cognitive:  Appropriate  Insight: Improving  Engagement in Group:  Engaged  Modes of Intervention:  Activity, Discussion, Education, Exploration and DBT, support, coping skills  Additional Comments: Topic of the group was discussing mindfulness and how it can be useful to managing symptoms. Group started with a ice breaker and a check in. Therapist reviewed with patients that mindfulness is the ability to be aware of your thoughts, emotions, physical sensations and actions-in the present moment-without judging or criticizing yourself or your experience. Patients learned then when we enter the state of awareness or mindfulness, we create a necessary space between the observing Self and what happens around and inside [place]. You can immerse yourself in the experience but not carried away with it. Patients learned that mindfulness skills are important because it can help you focus on one thing in the present moment, and by doing this they can better control and soothe their overwhelming emotions. Mindfulness will help them to learn to identify and separate judgmental thoughts from the experiences.  These judgmental thoughts often fuel their overwhelming emotions.  Patients also learned about wise mind and that mindfulness can help them to develop this skill.  Therapist guided patients through a mindfulness exercise. Tami Rodriguez said that exercise was relaxing and she would be willing to practice to help with mood. She said her depression was mild. Therapist discussed that developing more supports and not isolating at her house would improve depression. Patient says that she has trouble  with socializing and avoids it because some of her interactions cause anxiety and she does not feel comfortable with her skills. Therapist provided encouragement by relating that in group she does seems comfortable and does well in interacting with others. Since patient is uncomfortable in social situations and is isolating, the group provides a first step to help her isolate less and to help her socializing with others that will help her depression. Patient also expressed an interest in arts and crafts and therapist encouraged in pursuing her interests as this will provide positive experiences for her and help improve mood.   Plan: 1.Patients continue to practice mindfulness to help with managing emotions.   [facility] BH PHP THERAPIST GROUP NOTE  Date: 03/23/15  Time: 10:30 AM-12:00 PM   Group Topic/Focus:  The Daily Thought Record, Cognitive Distortions, Thought Fallacies of Assumptions and Recreations from the Past  Participation Level:  Active  Participation Quality:  Attentive  Affect:  Appropriate  Cognitive:  Appropriate  Insight: Improving  Engagement in Group:  Engaged  Modes of Intervention:  Activity, Discussion, Education, Exploration and CBT  Additional Comments: Therapist introduced CBT intervention of The Daily Thought Record and reviewed with patients this tool. This included noting an upsetting event, list the automatic thoughts, identify the distortion and then to challenge your thought with something reasonable and recognizing that your automatic thought is only one of several possible choices. Therapist introduced cognitive distortions and focused on thought fallacies that come from assumptions. Therapist asked patients to look at their assumptions and recognize that most of the time they are not true and they are more about our own issues.  Acknowledge they are assumptions and that you are  not sure what the other person is thinking unless you ask them.  Therapist also  discussed thought fallacies that we recreate situations from the past in the present that have nothing to do with what is going on. Recognize these patterns from the past we are projecting onto the present by asking ourselves if this is similar to anything in the past that might be triggering emotions like that? Patient recognizes that she has many of the thought distortions. Therapist also encouraged her to look at false assumptions that may be causing negative emotions. Patient is a positive group participant and was actively engaged through respectfully listening and actively participating.   Plan: Plan: 1.Patients use CBT strategies to challenge automatic thoughts and distortions to improve mood.2.Patients look at assumptions and thought fallacies to recreate situations from the past to prevent creation of negative emotions.   Diagnosis: Primary Diagnosis: Severe episode of recurrent major depressive disorder, without psychotic features (HCC) [F33.2]    1. Severe episode of recurrent major depressive disorder, without psychotic features (HCC)       Tami Rodriguez A 03/23/2015

## 2015-03-24 ENCOUNTER — Other Ambulatory Visit (HOSPITAL_COMMUNITY): Payer: BLUE CROSS/BLUE SHIELD | Admitting: *Deleted

## 2015-03-24 DIAGNOSIS — F331 Major depressive disorder, recurrent, moderate: Secondary | ICD-10-CM | POA: Diagnosis not present

## 2015-03-24 DIAGNOSIS — F332 Major depressive disorder, recurrent severe without psychotic features: Secondary | ICD-10-CM

## 2015-03-24 NOTE — Progress Notes (Signed)
[facility] BH PHP THERAPIST GROUP NOTE Tami Rodriguez  03/24/2015   Date: 03/24/2015  Session Time: 9 am - 12 PM  Group Topic/Focus: Group Therapy  Participation Level: Active  Participation Quality: Engaged  Affect :Appropriate, animated  Cognitive: Appropriate  Modes of Intervention: Motivational Interviewing, Solution Focused, Psycho Education on Psychosocial Skills and Supportive  Summary: Patient was first to join group today at 9 AM and shared that she would need to leave at noon in order to return to work due to short staff and commitment to help out. Patient reported that she felt little to no depression today and had no SI nor HI; appetite was poor while ability to concentrate was improving.  Patient appeared much brighter in affect today and reported she feels she is receiving benefits by doing home practice, being in group, re framing thoughts and questioning thoughts.   Patient shared freely and without prompting and was open and attentive to others that joined group later.  Patient shared that she identified with patient chip name 'sassy lilac'  today as "it's kind of a happy color, but not too bright and bold and has some energy to it." As patient wants to increase her communication skills we focussed on skills needed to join an ongoing group conversation by first learning how to determine if group was open or closed. We then explored ways to join an open conversation and then using skills to further conversation.   Patient left with 3 agreements for home practice over the weekend which included : 1. Plan (verses "might", then "try") to attend birthday party for neighbor's son 2. Spend 20 minutes outside daily which is often difficult; patient agreed to 20 minutes walks 3. Continue with journal writing of her gratitude and self care statements.   Primary Diagnosis: Severe Recurrent Depressive Disorder    Care Plan: Home Practice and Return to [facility] Program week of  03/27/15   [name], LCSW  03/24/2015

## 2015-03-27 ENCOUNTER — Other Ambulatory Visit (HOSPITAL_COMMUNITY): Payer: Self-pay

## 2015-03-28 ENCOUNTER — Other Ambulatory Visit (HOSPITAL_COMMUNITY): Payer: Self-pay

## 2015-03-29 ENCOUNTER — Telehealth (HOSPITAL_COMMUNITY): Payer: Self-pay | Admitting: Licensed Clinical Social Worker

## 2015-03-29 ENCOUNTER — Other Ambulatory Visit (HOSPITAL_COMMUNITY): Payer: Self-pay

## 2015-03-30 ENCOUNTER — Other Ambulatory Visit (HOSPITAL_COMMUNITY): Payer: BLUE CROSS/BLUE SHIELD | Attending: Psychiatry | Admitting: Licensed Clinical Social Worker

## 2015-03-30 DIAGNOSIS — F331 Major depressive disorder, recurrent, moderate: Secondary | ICD-10-CM | POA: Diagnosis not present

## 2015-03-30 DIAGNOSIS — F332 Major depressive disorder, recurrent severe without psychotic features: Secondary | ICD-10-CM

## 2015-03-30 NOTE — Psych (Signed)
[facility] BH PHP THERAPIST PROGRESS NOTE  Tami Rodriguez [id number]   [facility] BH PHP THERAPIST GROUP NOTE  Date: 03/30/15 Time: 9:00 AM-10:30 AM   Group Topic/Focus:  Group topic related to discussion of Ted Talk "How to Practice Emotional First Aid".   Participation Level:  Active  Participation Quality:  Attentive  Affect:  Appropriate  Cognitive:  Appropriate  Insight: Limited  Engagement in Group:  Engaged  Modes of Intervention:  Activity, Discussion, Education, Exploration, Support and coping skills  Additional Comments: Group topic related to discussion of Ted Talk "How to Practice Emotional First Aid". Group started with Rodriguez check in. The message of the video is that we take care of our physical health but we also have to pay attention and take care of our emotional hygiene. One experiences psychological injuries and we have to know how to take better care of them. He pointed out that when Rodriguez person experience loneliness that it distorts our perceptions and misleads [place] to where we feel emotionally and socially disconnected. When Rodriguez person experiences failure, the mind can trick itself and go to the default position of thinking that were are not capable. When we experience rejection and experience Rodriguez blow to our self-esteem, we can focus on our shortcomings causing use more damage instead of prioritizing our psychological health and practice self-compassion and to protect one's self-esteem. Rumination is Rodriguez habit that leads to depression. It may be hard to break the habit, but it helps to battle thinking and distracting helps to get out of rumination.  If we practice emotional hygiene and change Rodriguez few habits we can become more resilient, take care of our wounds and feel more self-empowered.   Tami Rodriguez checked in and said that she has missed therapy because she has been working. She related that money is stressing her out. She said that her choice is either sleep or therapy. The extra cost of her health  insurance and medications has also caused her extra expenses so she has been working more. She said she hasn't started the Zoloft because she has not had the money. Therapist encouraged her to apply the concept of worrying about the things that she can control and letting go of things she cannot. Therapist also discussed what she believed was an underlying cause of her depression and that is the estrangement with her parents. She said that she thinks she has made progress as she would not talk about it before but even now she has Rodriguez knot in her throat. Patients feedback was that if it is Rodriguez poisonous dynamic and one she does not think she can change that she may need to leave it alone. Therapist worked with patient on helping patient to recognize her worth and not allowing their behaviors and words to determine her worth. Therapist needs to continue to work with patient on ego strengths and self-esteem to help her manage lack of emotional support from patients.   Patient discussed that she could practice emotional hygiene by not making assumptions. Patient discussed not being comfortable in social situations and therapist and group encouraged her to like go of concerns of social judgements by others.     Plan: 1.Patient use insights from video to work on building skills to maintain emotional health.2.Therapist continue to help patient work through emotional symptoms to patient caused by her parents lack of emotional support for her.   [facility] BH PHP THERAPIST GROUP NOTE  Date: 03/30/15   Time: 10:30 AM-12:00 PM  Group Topic/Focus:   Group topic related to group members journaling on the topic "Would you do if you knew you could not fail?" The group members also identified three things that they are grateful for.  Participation Level:  Active  Participation Quality:  Appropriate and Sharing  Affect:  Appropriate  Cognitive:  Appropriate  Insight: Improving  Engagement in Group:  Engaged  Modes of  Intervention:  Activity, Discussion, Education, Exploration and coping skills  Additional Comments: Group topic related to group members journaling on the topic "Would you do if you knew you could not fail?" The group members also identified three things that they are grateful for. Therapist pointed out that the topic was empowering in allowing the patients to get in touch with their aspirations and in touch with who they are. 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 She related that the exercise helped her to gain perspective and her mindset if she wasn't having problems with depression.   Patients watched Rodriguez video titled "What causes anxiety and suffering". The patient learned that two things lead to anxiety and panic.  First cause is that you believe in your panic voice.  Wisdom comes from recognizing that this is only your own perception and that around 90% or more of what you project will not turn out the way you project.  The second is that because of your fear of panic(the unture voice) that you fight it and it makes it worse. The speaker in the video discussed that meditation and mindfulness helps Rodriguez person in maintaining Rodriguez healthy perspective and Tami Rodriguez shared that she has found learning and mindfulness helpful. She has been doing research on meditation on her own.     Plan: 1.Tami Rodriguez continue to use journaling as Rodriguez tool and learn and use other healthy coping skills.    Diagnosis: Primary Diagnosis: Severe episode of recurrent major depressive disorder, without psychotic features (HCC) [F33.2]    1. Severe episode of recurrent major depressive disorder, without psychotic features (HCC)       Bowman,Tami Rodriguez 03/30/2015

## 2015-04-04 ENCOUNTER — Other Ambulatory Visit (HOSPITAL_COMMUNITY): Payer: Self-pay

## 2015-04-04 ENCOUNTER — Telehealth (HOSPITAL_COMMUNITY): Payer: Self-pay | Admitting: Licensed Clinical Social Worker

## 2015-04-05 ENCOUNTER — Other Ambulatory Visit (HOSPITAL_COMMUNITY): Payer: Self-pay

## 2015-04-06 ENCOUNTER — Other Ambulatory Visit (HOSPITAL_COMMUNITY): Payer: Self-pay

## 2015-04-06 ENCOUNTER — Telehealth (HOSPITAL_COMMUNITY): Payer: Self-pay | Admitting: Licensed Clinical Social Worker

## 2015-04-07 ENCOUNTER — Other Ambulatory Visit (HOSPITAL_COMMUNITY): Payer: BLUE CROSS/BLUE SHIELD | Admitting: Licensed Clinical Social Worker

## 2015-04-07 DIAGNOSIS — F332 Major depressive disorder, recurrent severe without psychotic features: Secondary | ICD-10-CM

## 2015-04-07 DIAGNOSIS — F331 Major depressive disorder, recurrent, moderate: Secondary | ICD-10-CM | POA: Diagnosis not present

## 2015-04-07 NOTE — Psych (Signed)
[facility]  Tami BaldingMaria Rodriguez [id number]   [facility]  Date: 04/07/15  Time: 8:00 AM-1:00 PM   Group Topic/Focus:  Group topic related to CBT worksheet on depression; Group topic related to reviewing the principles of positive psychology; Patients were shown a video clip by by Tami LambLizzie Rodriguez titled "How Do You Define Yourself".  Participation Level:  Active  Participation Quality:  Attentive and Sharing  Affect:  Appropriate  Cognitive:  Appropriate  Insight: Improving  Engagement in Group:  Engaged  Modes of Intervention:  Activity, Discussion, Education, Exploration, Problem-solving, Support and DBT-emotional regulation, CBT, skills to build self-esteem, coping skills  Additional Comments: 8:00 AM-9:30 AMGroup topic related to CBT worksheet on depression. Group started with a check in. Group members were asked to explain what depression felt like and what happened right before they got the "blues". Patients were asked to identify their triggers. Patients were asked to list their unhealthy coping skill.  They were asked to recognize consequences of using unhealthy coping skills.  They were asked to identify healthy coping skills. The discussion led to mind body connection and taking care of one's physical vulnerability to overwhelming emotions. Patients talked about eating in healthy ways and a good suggestion was to think about how you feel after you eat to motivate yourself to eat in healthy ways. The group recognized the mind body connection and that taking care of ourselves physically will have a positive impact on our mental health. Therapist emphasized that our brain is connected to our body so it makes sense that taking care of ourselves physically will impact our mental health. Patients discussed how taking care of ourselves impacts our future and it helps to be conscious of this now so we can take care of ourselves. One group member  equated it to taking care of car to taking care of our body and that we have to do maintenance on a car.   Tami HesselbachMaria explained that she wants to continue but it is hard to get here because she works 7 days a week. She feels that it has helped and wants to try to continue. Patient relates that she is not as worried about the money. She sees lots of improvement, her energy is better, her motivation is getting there, has control of her feelings and she has more good days than bad. She feels that the medication has helped a lot. Patient said that she knows that she needs to take better care of herself physically for the future but through discussion patient agrees that bers recognize that it is waste of time to ruminate about the future. It is hard to be present if one is ruminating. In discussion it was agreed that it is good to be aware of the future so one is prepared but at the same time to be in present as we really only experience life in the present. Patient pointed that her relationship with her family is a trigger. Therapist related that she has worked and needs to continue to work on setting boundaries and this relationship has impacted her sense of worth. Therapist pointed out that setting boundaries will help protect her to not feel less than through interactions with them.   9:30 AM-11:00 AMGroup topic related to reviewing the principles of positive psychology. Therapist explained that in positive psychology the task is the escape the cult of the average and if we study the "outliers" who have exceeded the norm in  terms of happiness then we can move the average up. Therapist explained that the lens through which one view the work is how you shape your reality. If we change our internal mind we can create a more positive reality. Our environment shapes only 10 % of how happy we are and the rest, 90%, is created through our internal view of reality. A "happiness advantage" helps [place] in better functioning and  that means focusing on positivity in the present. There are activities that we can do that will increase the dopamine in the brain and that means we can train our brain to be more positive. Activities that help [place] to be more positive include exercise, journaling about a positive event in the last 24 hours, meditation, random acts of kindness, and choosing to be grateful for three things in your life.  Meditation helps [place] to focus on the present, that exercise teaches [place] that behavior matters and journaling helps [place] to relieve the positive experience. Patients discussed information and tied it to growth mindset.  If you practice anything you get good at it. One develops healthy habits. The discussion was helpful for patient to reinforce activities that elevate mood and help with our brain functioning.  11:00 AM-11:30 AM-Lunch 11:30 AM-1:00 PM-Patients were shown a video clip by Tami Rodriguez titled "How Do You Define Yourself".  The videos were shown to help patients explore each patient's pattern of self-defeating self-talk and beliefs about themselves.  The activity taught group members patients that they can decide themselves what defines them so that the choice is in their hands.  The activity helped them to verbalize positive feelings toward themselves.  Group members explored experiences from their own family or origin that may have contributed to their perspective of life.  Therapist explained that this video was helpful in building self-esteem as the power is in our hand to define who we are and not in anybody else's hands. One group member discussed that "fake it to you make it" helped him to build self-esteem and that it works. This tied it to developing habits of self-talk that will help build self-esteem. One group member explained that it helps to think why people do what they do. If someone is being a jerk you can trace it back to its origins including family discord or early life experiences.  Therapist made the connection to self-esteem and that we trace back to its origins low self-esteem. Therapist explained that situations in our life could have distorted our perspective of ourselves so it is important to reality that we can define who we are.   Tami Rodriguez related that her self-esteem is low. Therapist pointed out that this video was helpful in enhancing self-esteem as we can define who we want to be.   Plan: 1.Patient use CBT tools to identify triggers and then develop healthy coping strategies for triggers. 2.Patient use insights about mind body connection to take care of physical vulnerabilities such as making sure to eat to keep her body healthy.3.Patient work on developing healthy habits.4. Patient use growth mindset to realize that she can become good at anything he practices. 5.Patient use insight for growth self-esteem to decide himself what will define her.    Diagnosis: Primary Diagnosis: Severe episode of recurrent major depressive disorder, without psychotic features (HCC) [F33.2]    1. Severe episode of recurrent major depressive disorder, without psychotic features (HCC)       Rodriguez,Tami A 04/07/2015

## 2015-04-10 ENCOUNTER — Other Ambulatory Visit (HOSPITAL_COMMUNITY): Payer: Self-pay

## 2015-04-11 ENCOUNTER — Other Ambulatory Visit (HOSPITAL_COMMUNITY): Payer: Self-pay

## 2015-04-12 ENCOUNTER — Other Ambulatory Visit (HOSPITAL_COMMUNITY): Payer: Self-pay

## 2015-04-13 ENCOUNTER — Telehealth (HOSPITAL_COMMUNITY): Payer: Self-pay | Admitting: Licensed Clinical Social Worker

## 2015-04-13 ENCOUNTER — Other Ambulatory Visit (HOSPITAL_COMMUNITY): Payer: BLUE CROSS/BLUE SHIELD | Admitting: Licensed Clinical Social Worker

## 2015-04-13 ENCOUNTER — Other Ambulatory Visit (HOSPITAL_COMMUNITY): Payer: Self-pay

## 2015-04-13 NOTE — Telephone Encounter (Signed)
Therapist touched base with patient to relate that we needed to discharge her for days she missed group. Patient said that this was what she wanted to do as she has picked up extra shifts at work. She reports doing a lot better. She will come in to get discharge papers and [facility].

## 2015-04-14 ENCOUNTER — Ambulatory Visit (HOSPITAL_COMMUNITY): Payer: Self-pay | Admitting: Licensed Clinical Social Worker

## 2015-04-14 ENCOUNTER — Other Ambulatory Visit (HOSPITAL_COMMUNITY): Payer: Self-pay | Admitting: Licensed Clinical Social Worker

## 2015-04-14 NOTE — Psych (Addendum)
[facility] Psych Discharge Summary  Tami Rodriguez [id number]  Admission date: 03/09/15 Discharge date: 04/13/15  Reason for admission: Patient was a 20 year old singe female who presented with history of depression and anxiety. She reported worsening depression, sadness, some neuro-vegetative symptoms of depression. Because of worsening depression she had recently gone to the ED, not admitted, but referred to our program. She also endorsed decreased sense of self esteem. She did not identify any clear triggers for worsening mood. In addition to depression, she also endorsed significant anxiety, described as excessive worrying and some agoraphobia. She had a history of seizure disorder, and was on Lamictal. She was also being treated with Abilify. Patient did report a prior history of psychotic symptoms that started in 2015 when she first got diagnosed with epilepsy and had blackouts but no current psychotic symptoms noted. Patient was asked to follow up with her neurologist for seizure disorder management. Dr. Jama Flavorsobos reviewed that she should only drive if cleared/approved by her neurologist. [facility] and she was referred and [facility] at [facility]. Her psychiatrist was Dr. Shawnee KnappShugart and her counselor Ulice Boldarson Sarvis. She reported one stressor that was she had a poor relationship with her parents with little current communication. She felt that she needed to learn coping skills and how to deal with her emotions.      Progress in Program Toward Treatment Goals: Patient's goals in treatment included that 1)She wanted her mood to improve, to be stable and her mood not to be swinging back and forth and 2) She wanted to feel better about herself and work on self-esteem. Patient was invested in treatment and made good progress. Her progress was a result of learning and applying coping skills as well as the addition of Zoloft as a new  medication. She learned CBT skills to challenge cognitive distortions, identified unhealthy core beliefs such as "I am worthless and I won't be successful" and started to work on challenging these core beliefs. She worked on on positive coping skills to include reframing thoughts to focus more on the positive. She learned DBT skills such as distress tolerance and she felt that mindfulness and focusing on the present as well as moving on and now dwelling or ruminating was helpful in managing negative thoughts and ruminations. Tami Rodriguez reported that developing a routine as well as being extremely busy has helped her not have any "down time". A significant stressor for Tami Rodriguez that underlined her depression was family conflict, isolation and lack of support/communication from her family. Therapist worked with patient on this by encouraging her to work on herself until she was stronger to address family stressors. Patient worked on skills to improve self-esteem and setting boundaries so that the family stress would not get to her or define her worth. One source of worth that patient identified was her past contributions as she raised her siblings. Patient expressed that she had hurt and anger that she did not get the love she needed because her parents were caught up in their own issues. Tami Rodriguez relates that it is hard to accept how it happened. Therapist pointed out that it takes a while to work through hurt and grief. Therapist also discussed the DBT approach where healing comes through accepting people(that her family may not be what she had wished for) and oneself and at the same time working on change to help work through issues and make progress. Therapist also said  that support does not necessarily have to come from family and the idea as well that she could be a good parent to herself. Patient at the time of discharge reported increase number of hours at work due to financial stressors including paying for her health  care. She reported doing much better and her schedule did not allow her to continue with PHP. She also felt she was ready for stepdown. Tami Rodriguez's schedule is very busy with work but she would benefit from engagement in more positive activities such as hobbies that would also help to increase her support network and help her be less isolative. She continues she have perceived difficulties in establishing interpersonal connections. She needs to continue to work on automatic thoughts regarding social interactions. Of note patient did contact her neurologist who told her that she could not drive until seizure free x 6 months. Last seizure was in Oct 2016, so no driving until April 1610. Also, that she needed to take her medication, Lamictal, regularly for seizures.(note from neurologist office in record)   Progress (rationale): Patient made progress in PHP with improved mood and she related that " I am learning about better ways to cope". Her coping skills included establishing a routine, keeping busy, challenging cognitive distortions, reframing thoughts to focus on the positive, challenging core beliefs and mindfulness.  Tami Rodriguez should continue to the work she has done in [facility] to work on skills to manage emotions and deceasing negative thinking patterns. Tami Rodriguez needs to continue to work on building self-esteem and work on herself and that will also be helpful in dealing with family discord. She needs to continue work on setting boundaries with her family, building a larger support network and work through family stressors related to lack of contact/support.   Discharge Plan: Referral to Psychiatrist-05/18/15 at 12:00 PM Dr[facility] and Referral to Counselor/Psychotherapist-[facility] 05/22/15 at 8:00 AM.     Dx- Major Depression, Recurrent, No psychotic features, history of Seizure Disorder   Tami Rodriguez A 04/14/2015

## 2015-04-17 ENCOUNTER — Other Ambulatory Visit (HOSPITAL_COMMUNITY): Payer: Self-pay

## 2015-04-18 ENCOUNTER — Other Ambulatory Visit (HOSPITAL_COMMUNITY): Payer: Self-pay

## 2015-04-19 ENCOUNTER — Other Ambulatory Visit (HOSPITAL_COMMUNITY): Payer: Self-pay

## 2015-04-20 ENCOUNTER — Other Ambulatory Visit (HOSPITAL_COMMUNITY): Payer: Self-pay

## 2015-04-21 ENCOUNTER — Other Ambulatory Visit (HOSPITAL_COMMUNITY): Payer: Self-pay

## 2015-04-24 ENCOUNTER — Other Ambulatory Visit (HOSPITAL_COMMUNITY): Payer: Self-pay

## 2015-04-25 ENCOUNTER — Other Ambulatory Visit (HOSPITAL_COMMUNITY): Payer: Self-pay

## 2015-04-26 ENCOUNTER — Other Ambulatory Visit (HOSPITAL_COMMUNITY): Payer: Self-pay

## 2015-04-27 ENCOUNTER — Other Ambulatory Visit (HOSPITAL_COMMUNITY): Payer: Self-pay

## 2015-04-28 ENCOUNTER — Other Ambulatory Visit (HOSPITAL_COMMUNITY): Payer: Self-pay

## 2015-05-01 ENCOUNTER — Other Ambulatory Visit (HOSPITAL_COMMUNITY): Payer: Self-pay

## 2015-05-02 ENCOUNTER — Other Ambulatory Visit (HOSPITAL_COMMUNITY): Payer: Self-pay

## 2015-05-03 ENCOUNTER — Other Ambulatory Visit (HOSPITAL_COMMUNITY): Payer: Self-pay

## 2015-05-18 ENCOUNTER — Ambulatory Visit (INDEPENDENT_AMBULATORY_CARE_PROVIDER_SITE_OTHER): Payer: BLUE CROSS/BLUE SHIELD | Admitting: Psychiatry

## 2015-05-18 ENCOUNTER — Encounter (HOSPITAL_COMMUNITY): Payer: Self-pay | Admitting: Psychiatry

## 2015-05-18 VITALS — BP 118/68 | HR 75 | Ht 62.0 in | Wt 199.0 lb

## 2015-05-18 DIAGNOSIS — F4010 Social phobia, unspecified: Secondary | ICD-10-CM | POA: Insufficient documentation

## 2015-05-18 DIAGNOSIS — F411 Generalized anxiety disorder: Secondary | ICD-10-CM | POA: Diagnosis not present

## 2015-05-18 DIAGNOSIS — F333 Major depressive disorder, recurrent, severe with psychotic symptoms: Secondary | ICD-10-CM

## 2015-05-18 DIAGNOSIS — F410 Panic disorder [episodic paroxysmal anxiety] without agoraphobia: Secondary | ICD-10-CM | POA: Diagnosis not present

## 2015-05-18 MED ORDER — ARIPIPRAZOLE 20 MG PO TABS: 20.0000 mg | ORAL_TABLET | Freq: Every day | ORAL | Status: AC

## 2015-05-18 MED ORDER — SERTRALINE HCL 100 MG PO TABS: 100.0000 mg | ORAL_TABLET | Freq: Every day | ORAL | Status: AC

## 2015-05-18 NOTE — Progress Notes (Signed)
Psychiatric Initial Adult Assessment   Patient Identification: Tami BaldingMaria Rodriguez MRN:  [id number] Date of Evaluation:  05/18/2015 [facility] Source[facility] Chief Complaint:   Chief Complaint    Depression     Visit Diagnosis:    ICD-9-CM ICD-10-CM   1. Severe episode of recurrent major depressive disorder, with psychotic features (HCC) 296.34 F33.3 sertraline (ZOLOFT) 100 MG tablet     ARIPiprazole (ABILIFY) 20 MG tablet  2. GAD (generalized anxiety disorder) 300.02 F41.1 sertraline (ZOLOFT) 100 MG tablet     ARIPiprazole (ABILIFY) 20 MG tablet  3. Panic disorder without agoraphobia 300.01 F41.0 sertraline (ZOLOFT) 100 MG tablet  4. Social anxiety disorder 300.23 F40.10 sertraline (ZOLOFT) 100 MG tablet    History of Present Illness:  Depression is better as compared to before PHP. Pt started PHP because her depression was overwhelming. Pt states she has daily sad mood and doesn't find enjoyment in much. Reports anhedonia, isolation, crying spells, low motivation, poor hygiene, worthlessness and hopelessness. Pt tries to treat by distracting herself. Denies SI/HI.  Sleep is variable.  Appetite is variable. Energy is poor and she could lie in bed all day. Concentration is fair.   Pt worries about the past and the future. She often thinks about the worst case scenario. PHP helped her learn some coping skills. Anxiety did improve.  Reports her last random panic attack was in December 2016.  Pt reports long history of avoiding social interaction with others. Pt avoids going out and avoids talking to people. States she is anxious about how and what to talk to people about. She states she can't continue conversations but will answer questions when asked. Pt is worried about being judged.  States Zoloft, Abilify and Lamictal initially helped with mood. She missed a week and after restarting she felt they were making her feel drugged. She reports she has missed the meds several times since then.  Zoloft gives her nausea.  States Lamictal and Abilify are tolerable and denies SE.   Associated Signs/Symptoms: Depression Symptoms:  depressed mood, anhedonia, fatigue, feelings of worthlessness/guilt, hopelessness, anxiety, loss of energy/fatigue, increased appetite, decreased appetite,   (Hypo) Manic Symptoms:  negative  Denies manic and hypomanic symptoms including periods of decreased need for sleep, increased energy, mood lability, impulsivity, FOI, and excessive spending.  Anxiety Symptoms:  Excessive Worry, Panic Symptoms, Social Anxiety, denies symptoms of specific phobias and OCD   Psychotic Symptoms:  negative Pt states she randomly hears alarms going off. It happens 4-5x/day and it lasts for a few seconds  PTSD Symptoms: Negative  Past Psychiatric History:  Dx: Bipolar disorder was suggested by [facility] but not diagnosed, Depression, Anxiety Meds: Seroquel, Abilify Previous psychiatrist/therapist: [facility] Dr. Adriana ReamsShooter; [facility] Hospitalizations: PHP Feb 2017 SIB: denies Suicide attempts: denies Hx of violent behavior towards others: denies Current access to guns: denies Hx of abuse: denies Military Hx: denies Hx of Seizures: yes began at the age of 20, last was in Oct 2016 treated with Lamictal [facility] Hx of TBI: denies  Previous Psychotropic Medications: Yes   Substance Abuse History in the last 12 months:  No.  Consequences of Substance Abuse: Negative  Past Medical History:  Past Medical History  Diagnosis Date  . Chronic headaches   . Seizures (HCC)     10/24/14 unsure of last sz  . Depression   . Anxiety     Past Surgical History  Procedure Laterality Date  . Appendectomy  2011    Family  Psychiatric and Medical History:  Family History  Problem Relation Age of Onset  . Stroke Maternal Grandmother     Died in her 67's  . Schizophrenia Paternal Grandmother   . Seizures Cousin   . Healthy  Sister   . Healthy Brother   . Healthy Sister     Social History:   Social History   Social History  . Marital Status: Single    Spouse Name: N/A  . Number of Children: 0  . Years of Education: 12   Occupational History  .      [employer]   Social History Main Topics  . Smoking status: Never Smoker   . Smokeless tobacco: Never Used  . Alcohol Use: No  . Drug Use: No  . Sexual Activity:    Partners: Male    Birth Control/ Protection: Condom   Other Topics Concern  . None   Social History Narrative   Lives at home with a roommate in [place]. Born in [place] and raised in [place] by parents. Pt has one younger brother and 2 younger sisters. Pt is a HS grad and has a certificate in medical interpreting. Pt works at [employer]. Single, no kids.      Caffeine use-minimal    Allergies:   Allergies  Allergen Reactions  . Tegretol [Carbamazepine] Rash  . Penicillins Rash    Has patient had a PCN reaction causing immediate rash, facial/tongue/throat swelling, SOB or lightheadedness with hypotension: Yes Has patient had a PCN reaction causing severe rash involving mucus membranes or skin necrosis: No Has patient had a PCN reaction that required hospitalization No Has patient had a PCN reaction occurring within the last 10 years: No If all of the above answers are "NO", then may proceed with Cephalosporin use.     Metabolic Disorder Labs: No results found for: HGBA1C, MPG No results found for: PROLACTIN No results found for: CHOL, TRIG, HDL, CHOLHDL, VLDL, LDLCALC   Current Medications: Current Outpatient Prescriptions  Medication Sig Dispense Refill  . ARIPiprazole (ABILIFY) 20 MG tablet Take 20 mg by mouth daily.     Marland Kitchen lamoTRIgine (LAMICTAL) 100 MG tablet Take 1 tablet (100 mg total) by mouth 2 (two) times daily. 60 tablet 12  . sertraline (ZOLOFT) 50 MG tablet Take 1 tablet (50 mg total) by mouth daily. 15 tablet 1  . Melatonin 10 MG  TABS Take 10 mg by mouth Nightly. Reported on 05/18/2015     No current facility-administered medications for this visit.    Musculoskeletal: Strength & Muscle Tone: within normal limits Gait & Station: normal Patient leans: straight  Psychiatric Specialty Exam: Review of Systems  Constitutional: Negative for fever and chills.  HENT: Negative for congestion, sore throat and tinnitus.   Eyes: Positive for blurred vision. Negative for double vision, photophobia and pain.  Respiratory: Negative for cough, shortness of breath and wheezing.   Cardiovascular: Negative for chest pain, palpitations and leg swelling.  Gastrointestinal: Positive for nausea, vomiting and abdominal pain. Negative for heartburn.  Musculoskeletal: Negative for back pain, joint pain, falls and neck pain.  Skin: Negative for itching and rash.  Neurological: Positive for headaches. Negative for dizziness, tremors, sensory change, seizures, loss of consciousness and weakness.  Psychiatric/Behavioral: Positive for depression and hallucinations. Negative for suicidal ideas and substance abuse. The patient is nervous/anxious. The patient does not have insomnia.     Blood pressure 118/68, pulse 75, height  (1.575 m), weight 199 lb (90.266  kg).Body mass index is 36.39 kg/(m^2).  General Appearance: Casual  Eye Contact:  Good  Speech:  Clear and Coherent and Normal Rate  Volume:  Normal  Mood:  Anxious and Depressed  Affect:  Congruent  Thought Process:  Goal Directed  Orientation:  Full (Time, Place, and Person)  Thought Content:  Hallucinations: Auditory  Suicidal Thoughts:  No  Homicidal Thoughts:  No  Memory:  Immediate;   Good Recent;   Good Remote;   Good  Judgement:  Poor  Insight:  Fair  Psychomotor Activity:  Normal  Concentration:  Fair  Recall:  [employer] of Knowledge:Fair  Language: Good  Akathisia:  No  Handed:  Right  AIMS (if indicated):  AIMS:  Facial and Oral Movements  Muscles of  Facial Expression: None, normal  Lips and Perioral Area: None, normal  Jaw: None, normal  Tongue: None, normal Extremity Movements: Upper (arms, wrists, hands, fingers): None, normal  Lower (legs, knees, ankles, toes): None, normal,  Trunk Movements:  Neck, shoulders, hips: None, normal,  Overall Severity : Severity of abnormal movements (highest score from questions above): None, normal  Incapacitation due to abnormal movements: None, normal  Patient's awareness of abnormal movements (rate only patient's report): No Awareness, Dental Status  Current problems with teeth and/or dentures?: No  Does patient usually wear dentures?: No     Assets:  Communication Skills Desire for Improvement Resilience Talents/Skills Transportation  ADL's:  Intact  Cognition: WNL  Sleep:  poor    Treatment Plan Summary: Medication management and Plan see below  Assessment: MDD- recurrent, severe with psychotic features; GAD; Panic attacks without agoraphobia; Social anxiety disorder; r/o Bipolar disorder   Medication management with supportive therapy. Risks/benefits and SE of the medication discussed. Pt verbalized understanding and verbal consent obtained for treatment.  Affirm with the patient that the medications are taken as ordered. Patient expressed understanding of how their medications were to be used.  Meds: increase Zoloft to  po qD for mood and anxiety Abilify  po qD for mood Lamictal  po BID prescribed by neurologist   Labs: 02/23/2015  CBC WNL, CMP WNL except glu 119, UDS negative EKG 02/20/2015  QTc 423, sinus rhythm   Therapy: brief supportive therapy provided. Discussed psychosocial stressors in detail.   Encouraged pt to develop daily routine and work on daily goal setting as a way to improve mood symptoms.   Consultations:  Declined therapy  Pt denies SI and is at an acute low risk for suicide. Patient told to call clinic if any problems occur. Patient advised  to go to ER if they should develop SI/HI, side effects, or if symptoms worsen. Has crisis numbers to call if needed. Pt verbalized understanding.  F/up in 2 months or sooner if needed   Oletta Darter, MD 4/20/20171:39 PM

## 2015-05-22 ENCOUNTER — Ambulatory Visit (INDEPENDENT_AMBULATORY_CARE_PROVIDER_SITE_OTHER): Payer: BLUE CROSS/BLUE SHIELD | Admitting: Psychology

## 2015-05-22 ENCOUNTER — Encounter (HOSPITAL_COMMUNITY): Payer: Self-pay | Admitting: Psychology

## 2015-05-22 DIAGNOSIS — F331 Major depressive disorder, recurrent, moderate: Secondary | ICD-10-CM
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
## 2015-06-15 ENCOUNTER — Ambulatory Visit (INDEPENDENT_AMBULATORY_CARE_PROVIDER_SITE_OTHER): Payer: BLUE CROSS/BLUE SHIELD | Admitting: Psychology

## 2015-06-15 DIAGNOSIS — F4010 Social phobia, unspecified: Secondary | ICD-10-CM

## 2015-06-15 DIAGNOSIS — F331 Major depressive disorder, recurrent, moderate: Secondary | ICD-10-CM | POA: Diagnosis not present

## 2015-06-15 NOTE — Progress Notes (Signed)
   THERAPIST PROGRESS NOTE  Session Time: 11.11am-11.48am  Participation Level: Active  Behavioral Response: Well GroomedAlertAFFECT Bright  Type of Therapy: Individual Therapy  Treatment Goals addressed: Diagnosis: MDD, Social Anxietya nd goal 1  Interventions: CBT and Solution Focused  Summary: [name] is a 20 y.o. female who presents with full and bright affect.  Pt reported that she has been working a lot lately- but is still getting out of the house and reports she isn't feeling as anxious when talking w/ others.  Pt does report that still struggles w/ feeling like initiating conversation or continuing a conversation. Pt was able to gain increased awareness subjects that could talk about and to not feel pressured to share what doesn't want to talk about.  Pt acknowledged ok to not do story telling or getting into her past and did discuss how she set this boundary w/ a coworker that kept asking.  Pt reported she is excited to attend her sister's graduation next month and be present for her other sister's birthday.  Pt discussed wanting to get an self help audio book for her trip.  Pt identified that she enjoys being outside and did go walking for a week and then stopped.  Pt discussed how to restart.  Pt also reports feels that she has more direction w/ looking into small business and has been looking at free workshops in her community.  Pt agrees to bring that information to next session.    Suicidal/Homicidal: Nowithout intent/plan  Therapist Response: Assessed pt current functioning per pt report.  Processed w/pt engagement outside of house and pt mood.  Disucssed w/ pt social skills and building continued confidence w/ socializing.  Explored w/ pt her interests and ways to continue connecting w/.    Plan: Return again in 2 weeks.  Diagnosis: MDD, Social Anxiety    [place],LEANNE, [facility] 06/15/2015

## 2015-06-29 ENCOUNTER — Ambulatory Visit (HOSPITAL_COMMUNITY): Payer: Self-pay | Admitting: Psychology

## 2015-06-29 ENCOUNTER — Encounter (HOSPITAL_COMMUNITY): Payer: Self-pay | Admitting: Psychology

## 2015-06-29 NOTE — Progress Notes (Signed)
[name] is a 20 y.o. female patient who didn't show for her appointment today.  Letter sent.        Forde RadonYATES,[name], LPC

## 2015-07-13 ENCOUNTER — Ambulatory Visit (HOSPITAL_COMMUNITY): Payer: Self-pay | Admitting: Psychology

## 2015-07-18 ENCOUNTER — Ambulatory Visit (HOSPITAL_COMMUNITY): Payer: Self-pay | Admitting: Psychiatry

## 2015-07-27 ENCOUNTER — Ambulatory Visit (HOSPITAL_COMMUNITY): Payer: Self-pay | Admitting: Psychology

## 2015-09-05 ENCOUNTER — Ambulatory Visit: Payer: BLUE CROSS/BLUE SHIELD | Admitting: Diagnostic Neuroimaging

## 2015-11-10 ENCOUNTER — Emergency Department (HOSPITAL_COMMUNITY)
Admission: EM | Admit: 2015-11-10 | Discharge: 2015-11-10 | Disposition: A | Discharge status: Home / Self Care | Payer: Self-pay | Attending: Emergency Medicine | Admitting: Emergency Medicine

## 2015-11-10 ENCOUNTER — Emergency Department (HOSPITAL_COMMUNITY): Payer: Self-pay

## 2015-11-10 ENCOUNTER — Encounter (HOSPITAL_COMMUNITY): Payer: Self-pay | Admitting: Emergency Medicine

## 2015-11-10 DIAGNOSIS — Z79899 Other long term (current) drug therapy: Secondary | ICD-10-CM | POA: Insufficient documentation

## 2015-11-10 DIAGNOSIS — S39012A Strain of muscle, fascia and tendon of lower back, initial encounter: Secondary | ICD-10-CM

## 2015-11-10 DIAGNOSIS — Y939 Activity, unspecified: Secondary | ICD-10-CM | POA: Insufficient documentation

## 2015-11-10 DIAGNOSIS — S161XXA Strain of muscle, fascia and tendon at neck level, initial encounter: Secondary | ICD-10-CM

## 2015-11-10 DIAGNOSIS — Y999 Unspecified external cause status: Secondary | ICD-10-CM | POA: Insufficient documentation

## 2015-11-10 DIAGNOSIS — Y92410 Unspecified street and highway as the place of occurrence of the external cause: Secondary | ICD-10-CM | POA: Insufficient documentation

## 2015-11-10 MED ORDER — OXYCODONE-ACETAMINOPHEN 5-325 MG PO TABS
2.0000 | ORAL_TABLET | Freq: Once | ORAL | Status: AC
  Administered 2015-11-10: 2 via ORAL
  Filled 2015-11-10: qty 2

## 2015-11-10 MED ORDER — NAPROXEN 500 MG PO TABS: 500.0000 mg | ORAL_TABLET | Freq: Two times a day (BID) | ORAL | 0 refills | Status: AC

## 2015-11-10 MED ORDER — METHOCARBAMOL 500 MG PO TABS: 500.0000 mg | ORAL_TABLET | Freq: Two times a day (BID) | ORAL | 0 refills | Status: AC

## 2015-11-10 MED ORDER — KETOROLAC TROMETHAMINE 60 MG/2ML IM SOLN
60.0000 mg | Freq: Once | INTRAMUSCULAR | Status: AC
  Administered 2015-11-10: 60 mg via INTRAMUSCULAR
  Filled 2015-11-10: qty 2

## 2015-11-10 MED ORDER — METHOCARBAMOL 500 MG PO TABS
500.0000 mg | ORAL_TABLET | Freq: Once | ORAL | Status: AC
  Administered 2015-11-10: 500 mg via ORAL
  Filled 2015-11-10: qty 1
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
## 2015-11-10 NOTE — Discharge Instructions (Signed)
Your symptoms are likely due to a muscle strain. We advise that you take naproxen for pain and Robaxin for muscle spasms as needed. Follow-up with your doctor in 1 week to ensure resolution of symptoms. It is normal for your pain to worsen over the first 48 hours. You may return for any new or concerning symptoms.
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
## 2016-02-20 ENCOUNTER — Encounter (HOSPITAL_COMMUNITY): Payer: Self-pay | Admitting: Psychology

## 2016-02-20 NOTE — Progress Notes (Signed)
Tami BaldingMaria Rodriguez is a 21 y.o. female patient discharged from counseling since last seen on 06/15/15.  Outpatient Therapist Discharge Summary  Tami Rodriguez    Aug 19, 1995   Admission Date: 05/22/15   Discharge Date:  02/20/16 Reason for Discharge:  Didn't return for cousneling Diagnosis: MDD, Social Anxiety   Comments:  Pt may return as needed in future.  Alfredo BattyLeanne M Yates         YATES,LEANNE, LPC

## 2017-11-10 IMAGING — DX DG CHEST 2V
2 series · 2 of 2 positions shown · non-contrast
Comparison: Chest radiograph performed 10/27/2013

CLINICAL DATA: Acute onset of mid chest pain and shortness of
breath. Initial encounter.

EXAM:
CHEST  2 VIEW

[chest pa]
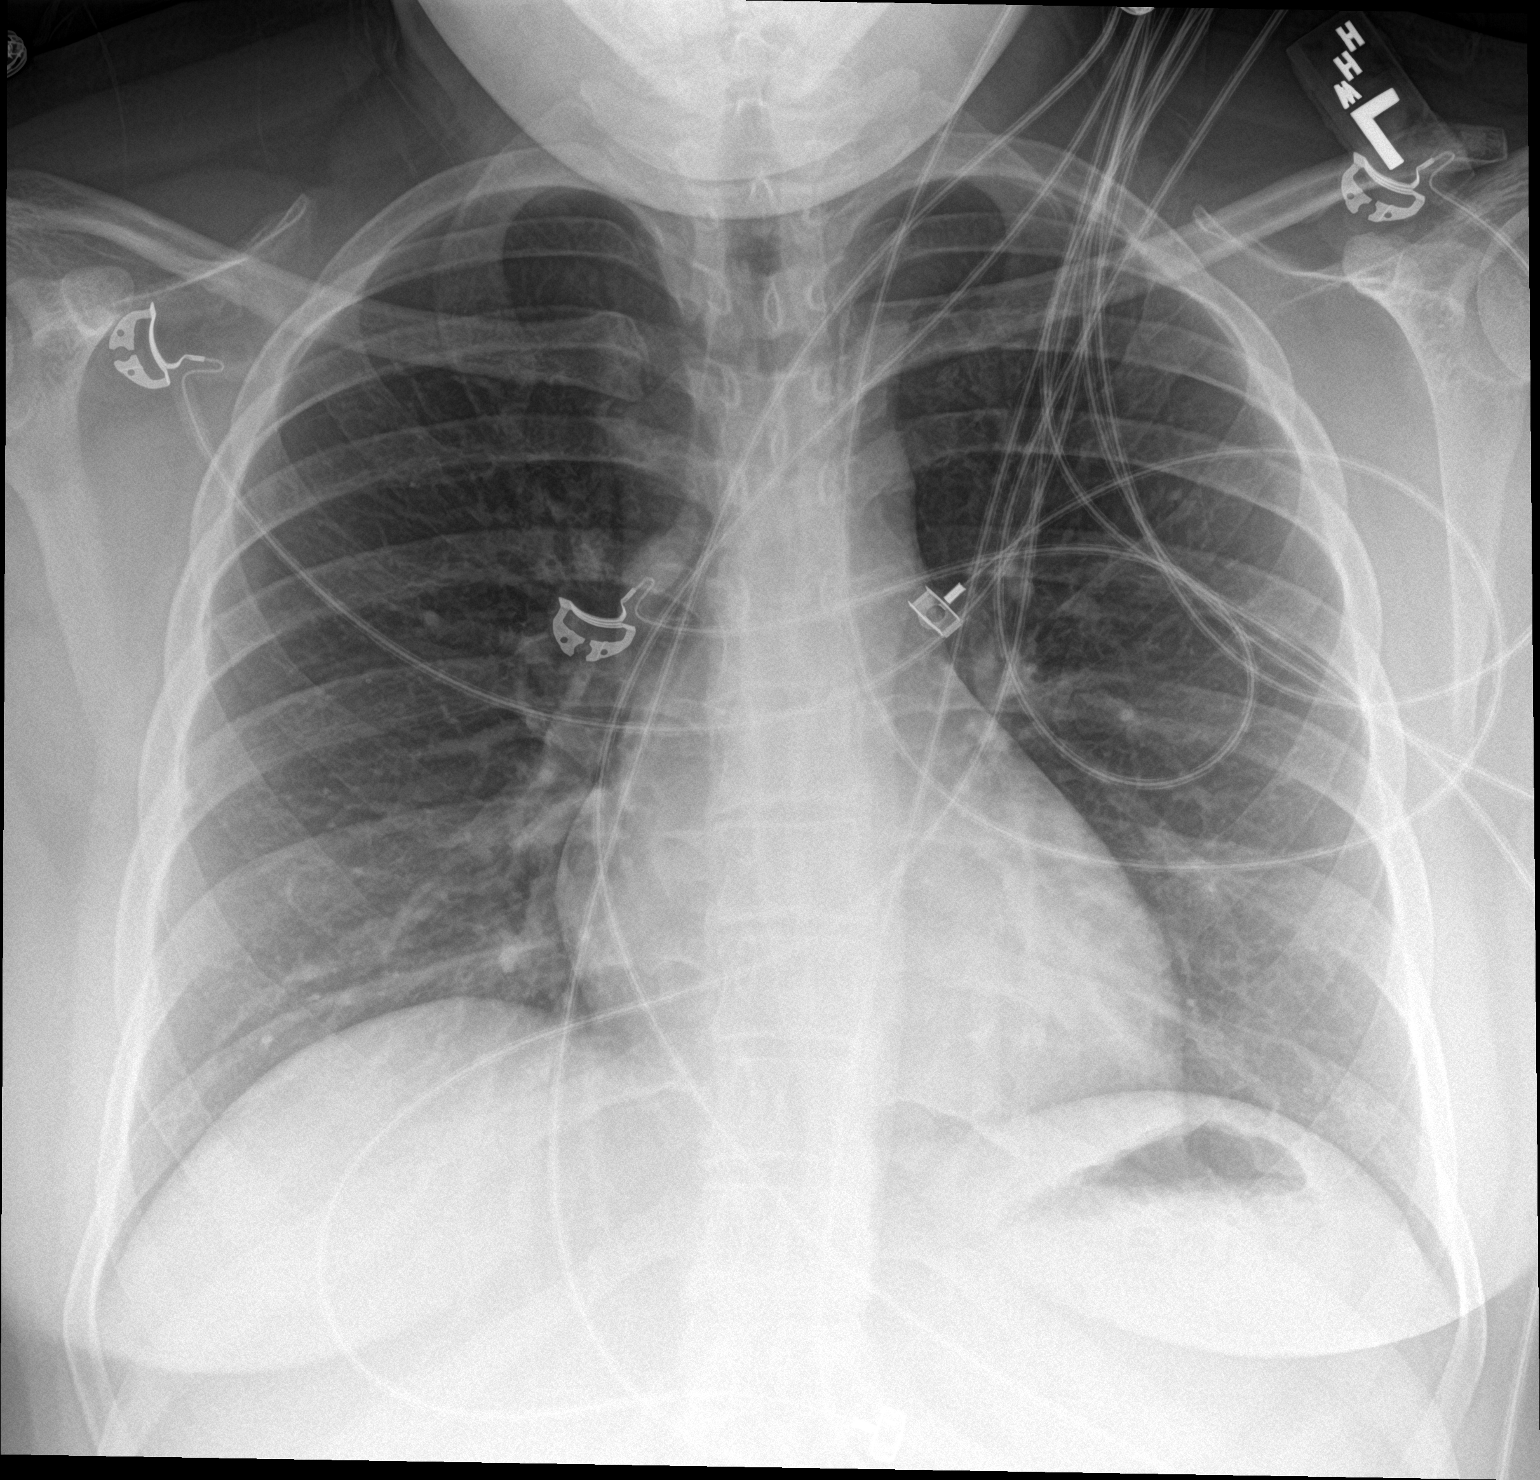

[chest lat]
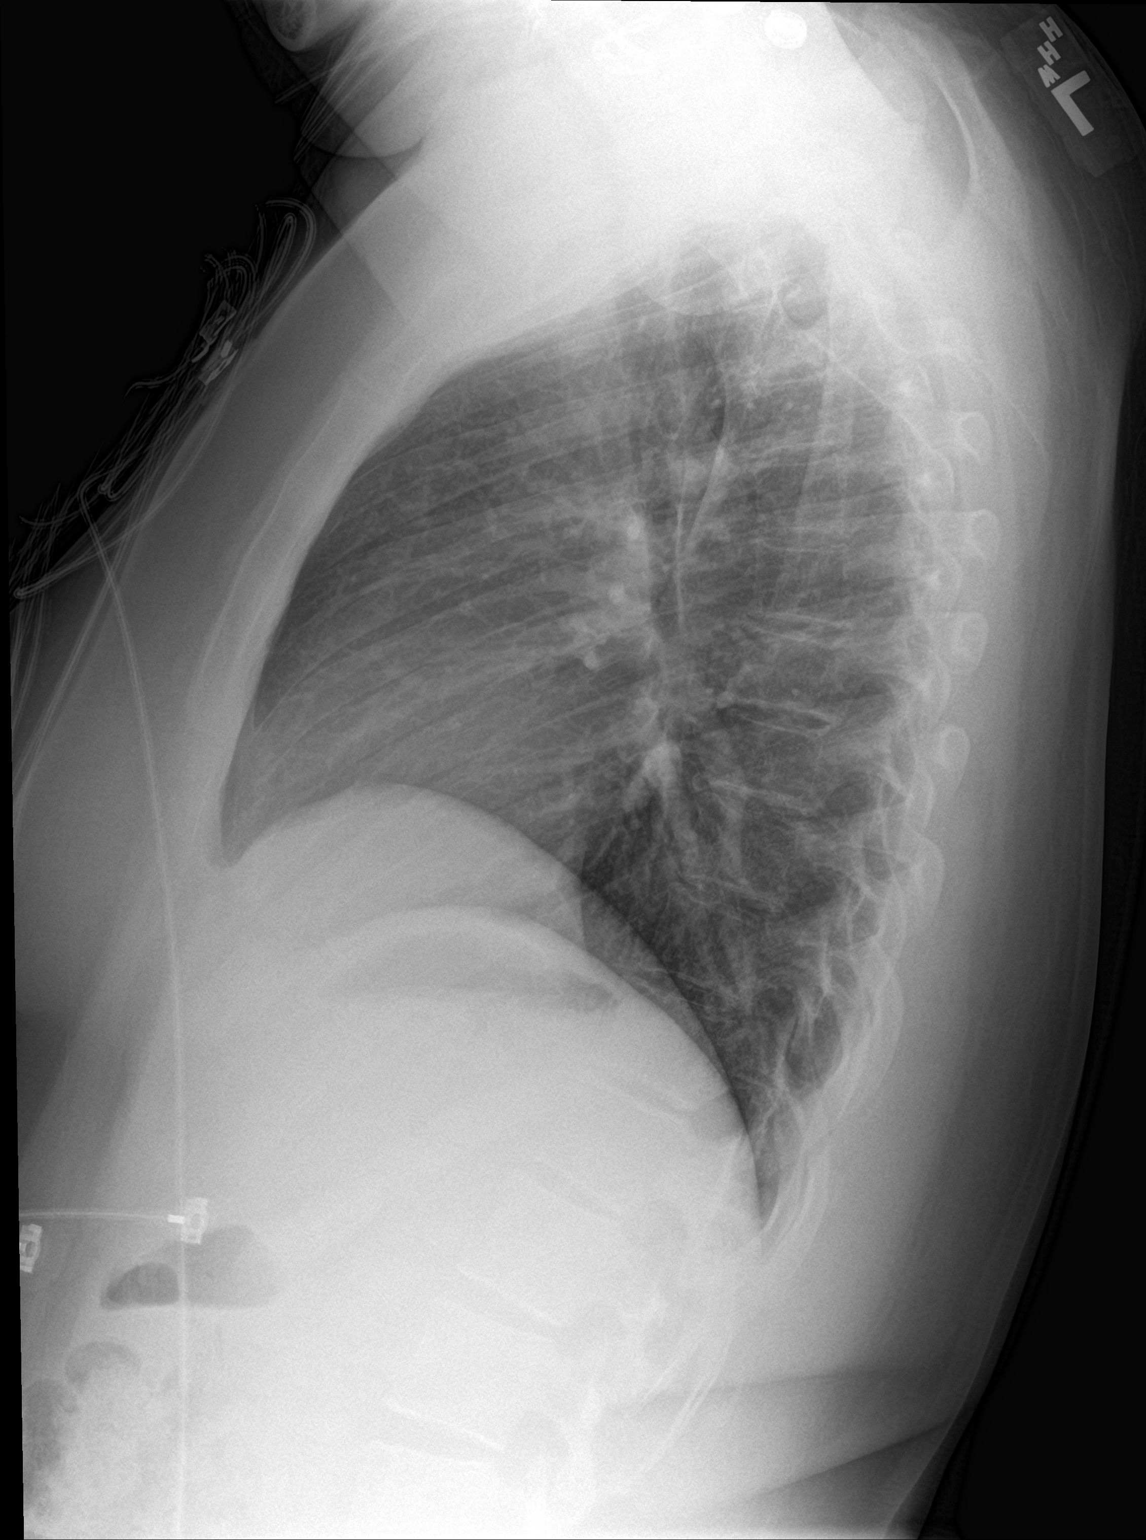

[2 of 2 positions shown; findings below may reference images not displayed]

FINDINGS: The lungs are well-aerated and clear. There is no evidence of focal
opacification, pleural effusion or pneumothorax.

The heart is normal in size; the mediastinal contour is within
normal limits. No acute osseous abnormalities are seen.
IMPRESSION: No acute cardiopulmonary process seen.
